# Patient Record
Sex: Female | Born: 1990 | Race: Black or African American | Hispanic: No | Marital: Single | Smoking: Former smoker
Health system: Southern US, Community
[De-identification: ages and names within clinical notes are randomized; demographics above are authoritative.]

## PROBLEM LIST (undated history)

## (undated) ENCOUNTER — Inpatient Hospital Stay (HOSPITAL_COMMUNITY): Payer: Self-pay

## (undated) DIAGNOSIS — Z8759 Personal history of other complications of pregnancy, childbirth and the puerperium: Secondary | ICD-10-CM

## (undated) DIAGNOSIS — R569 Unspecified convulsions: Secondary | ICD-10-CM

## (undated) DIAGNOSIS — O139 Gestational [pregnancy-induced] hypertension without significant proteinuria, unspecified trimester: Secondary | ICD-10-CM

## (undated) HISTORY — DX: Personal history of other complications of pregnancy, childbirth and the puerperium: Z87.59

## (undated) HISTORY — PX: NO PAST SURGERIES: SHX2092

---
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
## 2010-08-23 LAB — CBC
HCT: 32.9 % — ABNORMAL LOW (ref 36.0–46.0)
Hemoglobin: 10.6 g/dL — ABNORMAL LOW (ref 12.0–15.0)
MCHC: 32.3 g/dL (ref 30.0–36.0)
MCV: 82.3 fL (ref 78.0–100.0)
MCV: 82.9 fL (ref 78.0–100.0)
Platelets: 146 10*3/uL — ABNORMAL LOW (ref 150–400)
RBC: 4 MIL/uL (ref 3.87–5.11)
RDW: 14.3 % (ref 11.5–15.5)
RDW: 14.7 % (ref 11.5–15.5)
WBC: 13.5 10*3/uL — ABNORMAL HIGH (ref 4.0–10.5)

## 2010-09-06 LAB — URINE CULTURE: Colony Count: 1000

## 2010-09-06 LAB — URINE MICROSCOPIC-ADD ON

## 2010-09-06 LAB — WET PREP, GENITAL
Clue Cells Wet Prep HPF POC: NONE SEEN
Trich, Wet Prep: NONE SEEN

## 2010-09-06 LAB — URINALYSIS, ROUTINE W REFLEX MICROSCOPIC
Bilirubin Urine: NEGATIVE
Ketones, ur: NEGATIVE mg/dL
Protein, ur: NEGATIVE mg/dL
Specific Gravity, Urine: 1.015 (ref 1.005–1.030)

## 2010-09-06 LAB — GC/CHLAMYDIA PROBE AMP, GENITAL: GC Probe Amp, Genital: NEGATIVE

## 2010-10-17 NOTE — Discharge Summary (Signed)
NAME:  Jessica Hunter, BAL NO.:  [id number]   MEDICAL RECORD NO.:  [id number]          PATIENT TYPE:  INP   LOCATION:  9305                          FACILITY:  [facility]   PHYSICIAN:  Karlton Lemon, MD      DATE OF BIRTH:  [birth year]   DATE OF ADMISSION:  07/19/2007  DATE OF DISCHARGE:  07/21/2007                               DISCHARGE SUMMARY   ADMISSION DIAGNOSES:  1. Eclamptic seizure.  2. Postpartum day #6 from spontaneous vaginal delivery.   DISCHARGE DIAGNOSES:  1. Eclamptic seizure.  2. Postpartum day #8 from spontaneous vaginal delivery.   PROCEDURE:  None.   COMPLICATIONS:  The patient did have a seizure after arriving at the  [facility] emergency department that lasted a short period, no seizures  after initial presenting seizure.   CONSULTATIONS:  None.   LABORATORY DATA:  Jessica Hunter had admission CBC showing white blood cell  count 10.4, hemoglobin 12.3, hematocrit 38.0, platelets 292. Complete  metabolic panel showed sodium 139, potassium 3.6, chloride 106, CO2 22,  glucose 113, BUN 4, creatinine 0.61. Total bilirubin 0.6, alkaline  phosphatase 130, AST 19, ALT less than 8, total protein 6.6. Albumin  2.7, calcium 8.8. Urinalysis showed a small amount of blood, 100 of  protein, 1.0 urobilinogen, otherwise negative. Urine microscopy showed 7-  10 red blood cells. PTT was 26, PT was 13.0, INR was 1.0.   BRIEF ADMISSION HISTORY:  Jessica Hunter is a 20 year old, gravida 1, para 1-  0-0-1 and is postpartum day #6 from a spontaneous vaginal delivery here  at [facility]. She reportedly had a seizure that was generalized  tonic-clonic lasting 3-5 minutes at home. Emergency medical services  responded and in route to [facility] she had another seizure  lasting approximately 2-3 minutes. She did have one more seizure in the  [facility] emergency department of a short duration lasting 1-2 minutes.  She was initially assessed there and found to be  postictal. She was  started on magnesium 4 grams over 4 hours and transferred over to  [facility] for further management.   HOSPITAL COURSE:  The patient was admitted. Admission vitals were  temperature 98.2, pulse 78, respirations 16, blood pressure 136/74. She  did complain of a headache but otherwise her vision had no changes and  there was no abdominal pain. Her reflexes were 1+ without clonus. Her  neurologic clinical exam was grossly intact. Labs were as stated above.  She was continued on magnesium at 2 grams per hour and monitored for any  other neurologic symptoms. She was kept in the ICU until her magnesium  was stopped at noon on hospital day #2. She was transferred out of the  unit. Her blood pressure stayed in the 130 to 140/80-90 systolic range.  She was started on hydrochlorothiazide 25 mg. Hospital day #2, she still  had no neurologic symptoms. Blood pressures were ranging in the 130-140s  systolic over 80-90s diastolic. She continued to have no neurologic  symptoms and her physical examination was otherwise stable. She was  stable for discharge  on hospital day #3.   DISCHARGE STATUS:  Stable.   DISCHARGE MEDICATIONS:  1. Hydrochlorothiazide 25 mg by mouth once daily.  2. Prenatal vitamins 1 tablet by mouth daily.   DISCHARGE INSTRUCTIONS:  1. Discharged to home.  2. Activity as tolerated.  3. Regular diet.  4. The patient is to followup with her blood pressure check on      July 25, 2007 at 10 a.m. at the [facility]. The      patient has been informed of this appointment.  5. She is to followup at her postpartum examination a the [facility] Department at 6 weeks postpartum. She has been      instructed to return to the clinic if she develops new headaches,      vision changes other concerns.      Karlton Lemon, MD  Electronically Signed     NS/MEDQ  D:  07/21/2007  T:  07/22/2007  Job:  [id number]

## 2011-02-23 LAB — COMPREHENSIVE METABOLIC PANEL
ALT: <8
AST: 19
Alkaline Phosphatase: 180 — ABNORMAL HIGH
Alkaline Phosphatase: 130 — ABNORMAL HIGH
BUN: 1 — ABNORMAL LOW
BUN: 4 — ABNORMAL LOW
CO2: 25
Calcium: 8.3 — ABNORMAL LOW
Calcium: 8.8
Creatinine, Ser: 0.61
Glucose, Bld: 102 — ABNORMAL HIGH
Glucose, Bld: 113 — ABNORMAL HIGH
Potassium: 3.6
Total Bilirubin: 0.7
Total Bilirubin: 0.6
Total Protein: 5.8 — ABNORMAL LOW

## 2011-02-23 LAB — URINE MICROSCOPIC-ADD ON

## 2011-02-23 LAB — URINALYSIS, ROUTINE W REFLEX MICROSCOPIC
Ketones, ur: NEGATIVE
Leukocytes, UA: NEGATIVE
Specific Gravity, Urine: 1.016
Urobilinogen, UA: 1
pH: 7.5

## 2011-02-23 LAB — CBC
Hemoglobin: 10.8 — ABNORMAL LOW
MCHC: 32.3
MCV: 78.8
MCV: 79.8
Platelets: 225
RBC: 4.09
RBC: 4.76
RDW: 15.4

## 2011-03-13 LAB — CBC
MCHC: 33
MCV: 81.7
RDW: 13.7

## 2011-03-13 LAB — DIFFERENTIAL
Basophils Relative: 0
Eosinophils Absolute: 0.1 — ABNORMAL LOW
Eosinophils Relative: 1
Lymphocytes Relative: 11 — ABNORMAL LOW
Monocytes Relative: 5

## 2011-11-07 ENCOUNTER — Inpatient Hospital Stay (HOSPITAL_COMMUNITY)
Admission: AD | Admit: 2011-11-07 | Discharge: 2011-11-07 | Disposition: A | Discharge status: Home / Self Care | Payer: Self-pay | Source: Ambulatory Visit | Attending: Obstetrics and Gynecology | Admitting: Obstetrics and Gynecology

## 2011-11-07 DIAGNOSIS — K5900 Constipation, unspecified: Secondary | ICD-10-CM | POA: Insufficient documentation

## 2011-11-07 DIAGNOSIS — Z3202 Encounter for pregnancy test, result negative: Secondary | ICD-10-CM | POA: Insufficient documentation

## 2011-11-07 DIAGNOSIS — R109 Unspecified abdominal pain: Secondary | ICD-10-CM | POA: Insufficient documentation

## 2011-11-07 NOTE — MAU Note (Signed)
Lower abdominal pain onset last week, LMP 10/19/11 had negative pregnancy test here, no N/V/D, constipation unsure of when last BM was.

## 2011-11-07 NOTE — MAU Note (Signed)
Patient not in lobby x3 

## 2012-02-25 ENCOUNTER — Inpatient Hospital Stay (HOSPITAL_COMMUNITY): Payer: Self-pay

## 2012-02-25 ENCOUNTER — Inpatient Hospital Stay (HOSPITAL_COMMUNITY)
Admission: AD | Admit: 2012-02-25 | Discharge: 2012-02-25 | Disposition: A | Discharge status: Home / Self Care | Payer: Self-pay | Source: Ambulatory Visit | Attending: Obstetrics and Gynecology | Admitting: Obstetrics and Gynecology

## 2012-02-25 ENCOUNTER — Encounter (HOSPITAL_COMMUNITY): Payer: Self-pay | Admitting: *Deleted

## 2012-02-25 DIAGNOSIS — O4690 Antepartum hemorrhage, unspecified, unspecified trimester: Secondary | ICD-10-CM

## 2012-02-25 DIAGNOSIS — O209 Hemorrhage in early pregnancy, unspecified: Secondary | ICD-10-CM | POA: Insufficient documentation

## 2012-02-25 LAB — ABO/RH: ABO/RH(D): AB POS

## 2012-02-25 LAB — CBC
MCH: 26.3 pg (ref 26.0–34.0)
MCHC: 32.4 g/dL (ref 30.0–36.0)
Platelets: 346 10*3/uL (ref 150–400)
RBC: 4.63 MIL/uL (ref 3.87–5.11)
RDW: 13.6 % (ref 11.5–15.5)

## 2012-02-25 NOTE — MAU Note (Signed)
Pt is [redacted]weeks pregnant and an hour ago had a "gush" of blood.

## 2012-02-25 NOTE — MAU Provider Note (Signed)
History     CSN: [id number]  Arrival date and time: 02/25/12 1524   First Provider Initiated Contact with Patient 02/25/12 1640      Chief Complaint  Patient presents with  . Vaginal Bleeding   HPI Jessica Hunter is 21 y.o. [id number] [redacted]w[redacted]d weeks presenting with vaginal bleeding that began 2 hours ago but now has stopped. Large amount "like a period".  Last sexual activity was last night.  + HPT at home and [facility].   Not planned, not using contraception. Denies vaginal bleeding at this time.    Past Medical History  Diagnosis Date  . No pertinent past medical history     Past Surgical History  Procedure Date  . No past surgeries     Family History  Problem Relation Age of Onset  . Other Neg Hx     History  Substance Use Topics  . Smoking status: Never Smoker   . Smokeless tobacco: Never Used  . Alcohol Use: No    Allergies: No Known Allergies  No prescriptions prior to admission    Review of Systems  Constitutional: Negative.   Respiratory: Negative.   Cardiovascular: Negative.   Gastrointestinal: Negative for nausea, vomiting and abdominal pain.  Genitourinary:       + vaginal bleeding X 2 hrs today==red   Physical Exam   Blood pressure 140/77, pulse 91, temperature 98.1 F (36.7 C), temperature source Oral, resp. rate 20, height 5\' 5"  (1.651 m), weight 54.432 kg (120 lb), last menstrual period 01/19/2012.  Physical Exam  Constitutional: She is oriented to person, place, and time. She appears well-developed and well-nourished. No distress.  HENT:  Head: Normocephalic.  Neck: Normal range of motion.  Cardiovascular: Normal rate.   Respiratory: Effort normal.  GI: Soft. She exhibits no distension and no mass. There is no tenderness. There is no rebound and no guarding.  Genitourinary: Uterus is enlarged. Uterus is not tender. Right adnexum displays no mass, no tenderness and no fullness. Left adnexum displays no mass, no  tenderness and no fullness. No bleeding (no active bleeding) around the vagina. Injury: small amount of dark brown discharge. Vaginal discharge found.  Neurological: She is alert and oriented to person, place, and time.  Skin: Skin is warm and dry.  Psychiatric: She has a normal mood and affect. Her behavior is normal.   Results for orders placed during the hospital encounter of 02/25/12 (from the past 24 hour(s))  CBC     Status: Normal   Collection Time   02/25/12  5:10 PM      Component Value Range   WBC 10.5  4.0 - 10.5 K/uL   RBC 4.63  3.87 - 5.11 MIL/uL   Hemoglobin 12.2  12.0 - 15.0 g/dL   HCT 14.7  82.9 - 56.2 %   MCV 81.4  78.0 - 100.0 fL   MCH 26.3  26.0 - 34.0 pg   MCHC 32.4  30.0 - 36.0 g/dL   RDW 13.0  86.5 - 78.4 %   Platelets 346  150 - 400 K/uL  ABO/RH     Status: Normal (Preliminary result)   Collection Time   02/25/12  5:10 PM      Component Value Range   ABO/RH(D) AB POS    HCG, QUANTITATIVE, PREGNANCY     Status: Abnormal   Collection Time   02/25/12  5:10 PM      Component Value Range   hCG, Beta Chain, Quant,  S 4979 (*) <5 mIU/mL  WET PREP, GENITAL     Status: Abnormal   Collection Time   02/25/12  5:13 PM      Component Value Range   Yeast Wet Prep HPF POC FEW (*) NONE SEEN   Trich, Wet Prep NONE SEEN  NONE SEEN   Clue Cells Wet Prep HPF POC NONE SEEN  NONE SEEN   WBC, Wet Prep HPF POC MODERATE (*) NONE SEEN    MAU Course  Procedures  GC/CHL culture to lab  MDM   Assessment and Plan  A;  Vaginal  Bleeding in early pregnancy  [redacted]w[redacted]d gestation     Moderate to Large Subchorionic hemorrhage  P:  Discussed findings with the patient--cautioned about mod-lg [facility]       May begin prenatal care with doctor of her choice.    Aceton Kinnear,EVE M 02/25/2012, 7:03 PM

## 2012-02-27 LAB — GC/CHLAMYDIA PROBE AMP, GENITAL: Chlamydia, DNA Probe: POSITIVE — AB

## 2012-03-31 ENCOUNTER — Other Ambulatory Visit (HOSPITAL_COMMUNITY): Payer: Self-pay | Admitting: Obstetrics and Gynecology

## 2012-03-31 DIAGNOSIS — O3680X0 Pregnancy with inconclusive fetal viability, not applicable or unspecified: Secondary | ICD-10-CM

## 2012-04-01 ENCOUNTER — Ambulatory Visit (HOSPITAL_COMMUNITY)
Admission: RE | Admit: 2012-04-01 | Discharge: 2012-04-01 | Disposition: A | Discharge status: Home / Self Care | Payer: Self-pay | Source: Ambulatory Visit | Attending: Obstetrics and Gynecology | Admitting: Obstetrics and Gynecology

## 2012-04-01 DIAGNOSIS — Z3689 Encounter for other specified antenatal screening: Secondary | ICD-10-CM | POA: Insufficient documentation

## 2012-04-01 DIAGNOSIS — O3680X0 Pregnancy with inconclusive fetal viability, not applicable or unspecified: Secondary | ICD-10-CM | POA: Insufficient documentation

## 2012-04-22 ENCOUNTER — Encounter (HOSPITAL_COMMUNITY): Payer: Self-pay

## 2012-05-21 ENCOUNTER — Other Ambulatory Visit (HOSPITAL_COMMUNITY): Payer: Self-pay | Admitting: Physician Assistant

## 2012-05-21 DIAGNOSIS — Z3689 Encounter for other specified antenatal screening: Secondary | ICD-10-CM

## 2012-05-21 DIAGNOSIS — IMO0002 Reserved for concepts with insufficient information to code with codable children: Secondary | ICD-10-CM | POA: Insufficient documentation

## 2012-05-21 LAB — OB RESULTS CONSOLE RPR: RPR: NONREACTIVE

## 2012-05-21 LAB — OB RESULTS CONSOLE ABO/RH: RH Type: POSITIVE

## 2012-05-21 LAB — OB RESULTS CONSOLE HIV ANTIBODY (ROUTINE TESTING): HIV: NONREACTIVE

## 2012-05-21 LAB — OB RESULTS CONSOLE GC/CHLAMYDIA: Chlamydia: NEGATIVE

## 2012-06-02 ENCOUNTER — Ambulatory Visit (HOSPITAL_COMMUNITY)
Admission: RE | Admit: 2012-06-02 | Discharge: 2012-06-02 | Disposition: A | Discharge status: Home / Self Care | Payer: Medicaid Other | Source: Ambulatory Visit | Attending: Physician Assistant | Admitting: Physician Assistant

## 2012-06-02 DIAGNOSIS — O358XX0 Maternal care for other (suspected) fetal abnormality and damage, not applicable or unspecified: Secondary | ICD-10-CM | POA: Insufficient documentation

## 2012-06-02 DIAGNOSIS — Z1389 Encounter for screening for other disorder: Secondary | ICD-10-CM | POA: Insufficient documentation

## 2012-06-02 DIAGNOSIS — Z3689 Encounter for other specified antenatal screening: Secondary | ICD-10-CM

## 2012-06-02 DIAGNOSIS — Z363 Encounter for antenatal screening for malformations: Secondary | ICD-10-CM | POA: Insufficient documentation

## 2012-06-04 NOTE — L&D Delivery Note (Signed)
Delivery Note At 1:57 PM a viable female was delivered via Vaginal, Spontaneous Delivery (Presentation: Right Occiput Anterior).  APGAR: 8, 9; weight .   Placenta status: Intact, Spontaneous.  Cord: 3 vessels with the following complications: None.  Cord pH: N/A  Anesthesia: Epidural  Episiotomy: None Lacerations: None Suture Repair: N/A Est. Blood Loss (mL): 300  Mom to postpartum.  Baby to nursery-stable.  [name] Other 10/21/2012, 2:16 PM

## 2012-06-04 NOTE — L&D Delivery Note (Signed)
I was present for the delivery and agree with above.  [place], CNM 10/21/2012 9:05 PM

## 2012-06-11 ENCOUNTER — Other Ambulatory Visit (HOSPITAL_COMMUNITY): Payer: Self-pay | Admitting: Physician Assistant

## 2012-06-11 DIAGNOSIS — O358XX0 Maternal care for other (suspected) fetal abnormality and damage, not applicable or unspecified: Secondary | ICD-10-CM

## 2012-06-18 ENCOUNTER — Ambulatory Visit (HOSPITAL_COMMUNITY)
Admission: RE | Admit: 2012-06-18 | Discharge: 2012-06-18 | Disposition: A | Discharge status: Home / Self Care | Payer: Medicaid Other | Source: Ambulatory Visit | Attending: Physician Assistant | Admitting: Physician Assistant

## 2012-06-18 DIAGNOSIS — Z3689 Encounter for other specified antenatal screening: Secondary | ICD-10-CM | POA: Insufficient documentation

## 2012-06-18 DIAGNOSIS — O358XX0 Maternal care for other (suspected) fetal abnormality and damage, not applicable or unspecified: Secondary | ICD-10-CM

## 2012-09-27 ENCOUNTER — Encounter (HOSPITAL_COMMUNITY): Payer: Self-pay | Admitting: *Deleted

## 2012-09-27 ENCOUNTER — Inpatient Hospital Stay (HOSPITAL_COMMUNITY)
Admission: AD | Admit: 2012-09-27 | Discharge: 2012-09-27 | Disposition: A | Discharge status: Home / Self Care | Payer: Medicaid Other | Source: Ambulatory Visit | Attending: Obstetrics & Gynecology | Admitting: Obstetrics & Gynecology

## 2012-09-27 DIAGNOSIS — M545 Low back pain, unspecified: Secondary | ICD-10-CM

## 2012-09-27 DIAGNOSIS — R350 Frequency of micturition: Secondary | ICD-10-CM | POA: Insufficient documentation

## 2012-09-27 DIAGNOSIS — O99891 Other specified diseases and conditions complicating pregnancy: Secondary | ICD-10-CM | POA: Insufficient documentation

## 2012-09-27 DIAGNOSIS — O9989 Other specified diseases and conditions complicating pregnancy, childbirth and the puerperium: Secondary | ICD-10-CM

## 2012-09-27 LAB — URINE MICROSCOPIC-ADD ON

## 2012-09-27 LAB — URINALYSIS, ROUTINE W REFLEX MICROSCOPIC
Nitrite: NEGATIVE
Specific Gravity, Urine: 1.01 (ref 1.005–1.030)
Urobilinogen, UA: 1 mg/dL (ref 0.0–1.0)
pH: 7 (ref 5.0–8.0)

## 2012-09-27 NOTE — MAU Provider Note (Signed)
  History     CSN: [id number]  Arrival date and time: 09/27/12 1015   None     Chief Complaint  Patient presents with  . Back Pain   HPI Jessica Hunter is a 22 y.o. G40P2002 female at [redacted]w[redacted]d who reports intermittent achy low back pain x 3 days.  Reports good fm. Denies uc's, lof, or vb.  Reports urinary frequency x 2 months. Denies dysuria, hesitancy, or urgency.  Dx w/ UTI 2wks ago and completed therapy.  Next visit on Mon 4/28 @ [facility].   OB History   Grav Para Term Preterm Abortions TAB SAB Ect Mult Living   3 2 2       2       Past Medical History  Diagnosis Date  . No pertinent past medical history     Past Surgical History  Procedure Laterality Date  . No past surgeries      Family History  Problem Relation Age of Onset  . Other Neg Hx     History  Substance Use Topics  . Smoking status: Never Smoker   . Smokeless tobacco: Never Used  . Alcohol Use: No    Allergies: No Known Allergies  No prescriptions prior to admission    Review of Systems  Constitutional: Negative.  Negative for fever and chills.  HENT: Negative.   Eyes: Negative.   Respiratory: Negative.   Cardiovascular: Negative.   Gastrointestinal: Positive for nausea. Negative for vomiting and abdominal pain.  Genitourinary: Positive for frequency. Negative for dysuria, urgency, hematuria and flank pain.  Musculoskeletal: Positive for back pain (intermittent achy low back pain ).  Skin: Negative.   Neurological: Negative.   Endo/Heme/Allergies: Negative.   Psychiatric/Behavioral: Negative.    Physical Exam   Blood pressure 127/67, pulse 99, temperature 98 F (36.7 C), temperature source Oral, resp. rate 18, height 5\' 6"  (1.676 m), weight 69.673 kg (153 lb 9.6 oz), last menstrual period 01/19/2012.  Physical Exam  Constitutional: She is oriented to person, place, and time. She appears well-developed and well-nourished.  HENT:  Head: Normocephalic.  Neck: Normal range of motion.   Cardiovascular: Normal rate.   Respiratory: Effort normal.  GI: Soft.  gravid  Musculoskeletal: Normal range of motion.  Neurological: She is alert and oriented to person, place, and time.  Skin: Skin is warm and dry.  Psychiatric: She has a normal mood and affect. Her behavior is normal. Judgment and thought content normal.   FHR: 140, mod variability, 15x15accels, no decels= Cat I UCs: ocassional mild MAU Course  Procedures UA NST SVE x 2, no change Exam  Assessment and Plan  A:  [redacted]w[redacted]d SIUP  Low back pain in pregnancy  Cat I FHR P:  D/C home  Keep appt at [facility] on Mon as scheduled  Reviewed ptl s/s  Reviewed fetal kick counts  Marge Duncans 09/27/2012, 11:09 AM

## 2012-09-27 NOTE — MAU Note (Signed)
Pt c/o back pain on and off for the past 3 days. Denies vag bleeding  Reports a white vaginal discharge. Good fetal movement reported

## 2012-09-28 ENCOUNTER — Encounter (HOSPITAL_COMMUNITY): Payer: Self-pay | Admitting: *Deleted

## 2012-09-28 ENCOUNTER — Inpatient Hospital Stay (HOSPITAL_COMMUNITY)
Admission: AD | Admit: 2012-09-28 | Discharge: 2012-09-28 | Disposition: A | Discharge status: Home / Self Care | Payer: Medicaid Other | Source: Ambulatory Visit | Attending: Obstetrics & Gynecology | Admitting: Obstetrics & Gynecology

## 2012-09-28 DIAGNOSIS — O479 False labor, unspecified: Secondary | ICD-10-CM | POA: Insufficient documentation

## 2012-09-28 NOTE — MAU Note (Signed)
Pt presents with complaints of contractions that started late last night and have gotten worse this morning. States she was evaluated yesterday in MAU and was 3 cms dilated. Denies ROM or bleeding

## 2012-10-02 LAB — OB RESULTS CONSOLE GBS: GBS: POSITIVE

## 2012-10-15 ENCOUNTER — Inpatient Hospital Stay (HOSPITAL_COMMUNITY)
Admission: AD | Admit: 2012-10-15 | Discharge: 2012-10-15 | Disposition: A | Discharge status: Home / Self Care | Payer: Medicaid Other | Source: Ambulatory Visit | Attending: Obstetrics and Gynecology | Admitting: Obstetrics and Gynecology

## 2012-10-15 ENCOUNTER — Encounter (HOSPITAL_COMMUNITY): Payer: Self-pay

## 2012-10-15 DIAGNOSIS — N949 Unspecified condition associated with female genital organs and menstrual cycle: Secondary | ICD-10-CM | POA: Insufficient documentation

## 2012-10-15 DIAGNOSIS — O99891 Other specified diseases and conditions complicating pregnancy: Secondary | ICD-10-CM | POA: Insufficient documentation

## 2012-10-15 DIAGNOSIS — O479 False labor, unspecified: Secondary | ICD-10-CM

## 2012-10-15 DIAGNOSIS — M7989 Other specified soft tissue disorders: Secondary | ICD-10-CM | POA: Insufficient documentation

## 2012-10-15 LAB — URINALYSIS, ROUTINE W REFLEX MICROSCOPIC
Bilirubin Urine: NEGATIVE
Glucose, UA: NEGATIVE mg/dL
Protein, ur: NEGATIVE mg/dL
Urobilinogen, UA: 0.2 mg/dL (ref 0.0–1.0)

## 2012-10-15 LAB — URINE MICROSCOPIC-ADD ON

## 2012-10-15 NOTE — MAU Provider Note (Signed)
History     CSN: [id number]  Arrival date and time: 10/15/12 1755   First Provider Initiated Contact with Patient 10/15/12 1844      Chief Complaint  Patient presents with  . Pelvic Pain  . Leg Swelling   HPI Comments: 22 yo G3P2002 @ 38.4 presents with pelvic pain. Pain is groin/supropubic, worse with walking/standing.  Feels different than contractions.  No pain with urination.  Has not tried anything for this pain.  Was checked at the [facility] 2 days ago and told she was 3cm.  She just wants to make sure she is not in labor.   Irregular/occasional ctx, no LOF, no VB, good FM  Pelvic Pain The patient's primary symptoms include pelvic pain. Pertinent negatives include no abdominal pain, dysuria, fever, headaches, nausea or vomiting.    OB History   Grav Para Term Preterm Abortions TAB SAB Ect Mult Living   3 2 2       2       Past Medical History  Diagnosis Date  . No pertinent past medical history   . Medical history non-contributory     Past Surgical History  Procedure Laterality Date  . No past surgeries      Family History  Problem Relation Age of Onset  . Other Neg Hx     History  Substance Use Topics  . Smoking status: Never Smoker   . Smokeless tobacco: Never Used  . Alcohol Use: No    Allergies: No Known Allergies  Prescriptions prior to admission  Medication Sig Dispense Refill  . Prenatal Vit-Fe Fumarate-FA (PRENATAL MULTIVITAMIN) TABS Take 1 tablet by mouth daily.         Review of Systems  Constitutional: Negative for fever.  Respiratory: Negative for shortness of breath.   Cardiovascular: Negative for chest pain.  Gastrointestinal: Negative for nausea, vomiting and abdominal pain.  Genitourinary: Positive for pelvic pain. Negative for dysuria.  Neurological: Negative for headaches.   Physical Exam   Blood pressure 122/80, pulse 100, temperature 98.5 F (36.9 C), temperature source Oral, resp. rate 18, height 5\' 6"  (1.676 m), weight 72.349  kg (159 lb 8 oz), last menstrual period 01/19/2012.  Physical Exam  Nursing note and vitals reviewed. Constitutional: She is oriented to person, place, and time. She appears well-developed and well-nourished. No distress.  HENT:  Head: Normocephalic and atraumatic.  Eyes: EOM are normal. Pupils are equal, round, and reactive to light.  Neck: Normal range of motion. Neck supple.  Cardiovascular: Normal rate and regular rhythm.   Respiratory: Effort normal. No respiratory distress.  GI: Soft. There is no tenderness.  gravid  Musculoskeletal: She exhibits no edema.  Neurological: She is alert and oriented to person, place, and time.  Skin: Skin is warm and dry. No rash noted. She is not diaphoretic.  Dilation: 3 Effacement (%): 50 Cervical Position: Middle Station: -3 Presentation: Vertex Exam by:: Dr. Fara Boros   FHT: 150, mod variability, + accel, no decel  Toco: no contractions  MAU Course  Procedures  MDM Cervix unchanged from office check 2 days ago, no ctx on monitor  Assessment and Plan  22 yo G3P2002 @ 38.4 p/w complaints of pelvic discomfort -Likely MSK in nature, no cervical change, no contractions, no vaginal bleeding, no abdominal tenderness -Advised tylenol, warm bath/shower, heating pad for symptomatic treatment -Labor and bleed precautions discussed. -F/u with OB next week as scheduled   MCGILL,JACQUELYN 10/15/2012, 6:47 PM   I saw and examined patient and agree  with above resident note. I reviewed history, imaging, labs, and vitals. I personally reviewed the fetal heart tracing, and it is reactive. Napoleon Form, MD

## 2012-10-15 NOTE — MAU Note (Signed)
Patient is in with c/o pelvic pain and bilateral lower extremity swelling. She denies vaginal bleeding or lof. She reports good fetal movement.

## 2012-10-16 LAB — URINE CULTURE: Culture: NO GROWTH

## 2012-10-17 NOTE — MAU Provider Note (Signed)
Attestation of Attending Supervision of Advanced Practitioner (CNM/NP)/[employer]/Fellow: Evaluation and management procedures were performed by the Advanced Practitioner under my supervision and collaboration.  I have reviewed the Advanced Practitioner's note and chart, and I agree with the management and plan.  HARRAWAY-Koudelka, Evelette Hollern 9:44 AM

## 2012-10-21 ENCOUNTER — Inpatient Hospital Stay (HOSPITAL_COMMUNITY): Payer: Medicaid Other | Admitting: Anesthesiology

## 2012-10-21 ENCOUNTER — Encounter (HOSPITAL_COMMUNITY): Payer: Self-pay | Admitting: *Deleted

## 2012-10-21 ENCOUNTER — Encounter (HOSPITAL_COMMUNITY): Payer: Self-pay | Admitting: Anesthesiology

## 2012-10-21 ENCOUNTER — Inpatient Hospital Stay (HOSPITAL_COMMUNITY)
Admission: AD | Admit: 2012-10-21 | Discharge: 2012-10-23 | DRG: 775 | Disposition: A | Discharge status: Home / Self Care | Payer: Medicaid Other | Source: Ambulatory Visit | Attending: Obstetrics & Gynecology | Admitting: Obstetrics & Gynecology

## 2012-10-21 DIAGNOSIS — O4290 Premature rupture of membranes, unspecified as to length of time between rupture and onset of labor, unspecified weeks of gestation: Secondary | ICD-10-CM

## 2012-10-21 DIAGNOSIS — IMO0001 Reserved for inherently not codable concepts without codable children: Secondary | ICD-10-CM

## 2012-10-21 DIAGNOSIS — Z22330 Carrier of Group B streptococcus: Secondary | ICD-10-CM

## 2012-10-21 DIAGNOSIS — B951 Streptococcus, group B, as the cause of diseases classified elsewhere: Secondary | ICD-10-CM | POA: Diagnosis present

## 2012-10-21 DIAGNOSIS — O9989 Other specified diseases and conditions complicating pregnancy, childbirth and the puerperium: Secondary | ICD-10-CM

## 2012-10-21 DIAGNOSIS — O99892 Other specified diseases and conditions complicating childbirth: Secondary | ICD-10-CM | POA: Diagnosis present

## 2012-10-21 LAB — CBC
MCH: 26.5 pg (ref 26.0–34.0)
MCHC: 32.5 g/dL (ref 30.0–36.0)
MCV: 81.3 fL (ref 78.0–100.0)
Platelets: 154 10*3/uL (ref 150–400)
RBC: 4.61 MIL/uL (ref 3.87–5.11)
RDW: 15.3 % (ref 11.5–15.5)

## 2012-10-21 LAB — TYPE AND SCREEN: ABO/RH(D): AB POS

## 2012-10-21 MED ORDER — OXYTOCIN BOLUS FROM INFUSION: 500.0000 mL | INTRAVENOUS | Status: DC

## 2012-10-21 MED ORDER — ONDANSETRON HCL 4 MG/2ML IJ SOLN: 4.0000 mg | Freq: Four times a day (QID) | INTRAMUSCULAR | Status: DC | PRN

## 2012-10-21 MED ORDER — SIMETHICONE 80 MG PO CHEW: 80.0000 mg | CHEWABLE_TABLET | ORAL | Status: DC | PRN

## 2012-10-21 MED ORDER — CITRIC ACID-SODIUM CITRATE 334-500 MG/5ML PO SOLN: 30.0000 mL | ORAL | Status: DC | PRN

## 2012-10-21 MED ORDER — LACTATED RINGERS IV SOLN
INTRAVENOUS | Status: DC
  Administered 2012-10-21 (×2): via INTRAVENOUS

## 2012-10-21 MED ORDER — WITCH HAZEL-GLYCERIN EX PADS: 1.0000 "application " | MEDICATED_PAD | CUTANEOUS | Status: DC | PRN

## 2012-10-21 MED ORDER — OXYTOCIN 40 UNITS IN LACTATED RINGERS INFUSION - SIMPLE MED
1.0000 m[IU]/min | INTRAVENOUS | Status: DC
  Administered 2012-10-21: 2 m[IU]/min via INTRAVENOUS
  Filled 2012-10-21: qty 1000

## 2012-10-21 MED ORDER — ONDANSETRON HCL 4 MG PO TABS: 4.0000 mg | ORAL_TABLET | ORAL | Status: DC | PRN

## 2012-10-21 MED ORDER — EPHEDRINE 5 MG/ML INJ
10.0000 mg | INTRAVENOUS | Status: DC | PRN
  Filled 2012-10-21: qty 4
  Filled 2012-10-21: qty 2

## 2012-10-21 MED ORDER — TETANUS-DIPHTH-ACELL PERTUSSIS 5-2.5-18.5 LF-MCG/0.5 IM SUSP
0.5000 mL | Freq: Once | INTRAMUSCULAR | Status: AC
  Administered 2012-10-22: 0.5 mL via INTRAMUSCULAR
  Filled 2012-10-21: qty 0.5

## 2012-10-21 MED ORDER — AMPICILLIN SODIUM 1 G IJ SOLR
1.0000 g | INTRAMUSCULAR | Status: DC
  Administered 2012-10-21 (×2): 1 g via INTRAVENOUS
  Filled 2012-10-21 (×6): qty 1000

## 2012-10-21 MED ORDER — DIBUCAINE 1 % RE OINT: 1.0000 "application " | TOPICAL_OINTMENT | RECTAL | Status: DC | PRN

## 2012-10-21 MED ORDER — LACTATED RINGERS IV SOLN: 500.0000 mL | INTRAVENOUS | Status: DC | PRN

## 2012-10-21 MED ORDER — ACETAMINOPHEN 325 MG PO TABS: 650.0000 mg | ORAL_TABLET | ORAL | Status: DC | PRN

## 2012-10-21 MED ORDER — SODIUM CHLORIDE 0.9 % IV SOLN
2.0000 g | Freq: Once | INTRAVENOUS | Status: AC
  Administered 2012-10-21: 2 g via INTRAVENOUS
  Filled 2012-10-21: qty 2000

## 2012-10-21 MED ORDER — OXYCODONE-ACETAMINOPHEN 5-325 MG PO TABS
1.0000 | ORAL_TABLET | ORAL | Status: DC | PRN
  Administered 2012-10-22: 1 via ORAL
  Filled 2012-10-21: qty 1

## 2012-10-21 MED ORDER — OXYTOCIN 40 UNITS IN LACTATED RINGERS INFUSION - SIMPLE MED: 62.5000 mL/h | INTRAVENOUS | Status: DC

## 2012-10-21 MED ORDER — TERBUTALINE SULFATE 1 MG/ML IJ SOLN: 0.2500 mg | Freq: Once | INTRAMUSCULAR | Status: DC | PRN

## 2012-10-21 MED ORDER — ZOLPIDEM TARTRATE 5 MG PO TABS: 5.0000 mg | ORAL_TABLET | Freq: Every evening | ORAL | Status: DC | PRN

## 2012-10-21 MED ORDER — LANOLIN HYDROUS EX OINT: TOPICAL_OINTMENT | CUTANEOUS | Status: DC | PRN

## 2012-10-21 MED ORDER — PHENYLEPHRINE 40 MCG/ML (10ML) SYRINGE FOR IV PUSH (FOR BLOOD PRESSURE SUPPORT)
80.0000 ug | PREFILLED_SYRINGE | INTRAVENOUS | Status: DC | PRN
  Filled 2012-10-21: qty 2
  Filled 2012-10-21: qty 5

## 2012-10-21 MED ORDER — PRENATAL MULTIVITAMIN CH
1.0000 | ORAL_TABLET | Freq: Every day | ORAL | Status: DC
  Administered 2012-10-22 – 2012-10-23 (×2): 1 via ORAL
  Filled 2012-10-21 (×2): qty 1

## 2012-10-21 MED ORDER — NALBUPHINE SYRINGE 5 MG/0.5 ML
10.0000 mg | INJECTION | INTRAMUSCULAR | Status: DC | PRN
  Filled 2012-10-21: qty 1

## 2012-10-21 MED ORDER — IBUPROFEN 600 MG PO TABS
600.0000 mg | ORAL_TABLET | Freq: Four times a day (QID) | ORAL | Status: DC
  Administered 2012-10-21 – 2012-10-23 (×8): 600 mg via ORAL
  Filled 2012-10-21 (×8): qty 1

## 2012-10-21 MED ORDER — OXYCODONE-ACETAMINOPHEN 5-325 MG PO TABS: 1.0000 | ORAL_TABLET | ORAL | Status: DC | PRN

## 2012-10-21 MED ORDER — DIPHENHYDRAMINE HCL 50 MG/ML IJ SOLN
12.5000 mg | INTRAMUSCULAR | Status: DC | PRN
  Administered 2012-10-21: 12.5 mg via INTRAVENOUS
  Filled 2012-10-21: qty 1

## 2012-10-21 MED ORDER — IBUPROFEN 600 MG PO TABS: 600.0000 mg | ORAL_TABLET | Freq: Four times a day (QID) | ORAL | Status: DC | PRN

## 2012-10-21 MED ORDER — PHENYLEPHRINE 40 MCG/ML (10ML) SYRINGE FOR IV PUSH (FOR BLOOD PRESSURE SUPPORT)
80.0000 ug | PREFILLED_SYRINGE | INTRAVENOUS | Status: DC | PRN
  Filled 2012-10-21: qty 2

## 2012-10-21 MED ORDER — FENTANYL 2.5 MCG/ML BUPIVACAINE 1/10 % EPIDURAL INFUSION (WH - ANES)
14.0000 mL/h | INTRAMUSCULAR | Status: DC | PRN
  Administered 2012-10-21 (×2): 14 mL/h via EPIDURAL
  Filled 2012-10-21 (×2): qty 125

## 2012-10-21 MED ORDER — BENZOCAINE-MENTHOL 20-0.5 % EX AERO
1.0000 "application " | INHALATION_SPRAY | CUTANEOUS | Status: DC | PRN
  Administered 2012-10-23: 1 via TOPICAL
  Filled 2012-10-21: qty 56

## 2012-10-21 MED ORDER — SENNOSIDES-DOCUSATE SODIUM 8.6-50 MG PO TABS
2.0000 | ORAL_TABLET | Freq: Every day | ORAL | Status: DC
  Administered 2012-10-21 – 2012-10-22 (×2): 2 via ORAL

## 2012-10-21 MED ORDER — EPHEDRINE 5 MG/ML INJ
10.0000 mg | INTRAVENOUS | Status: DC | PRN
  Filled 2012-10-21: qty 2

## 2012-10-21 MED ORDER — ONDANSETRON HCL 4 MG/2ML IJ SOLN: 4.0000 mg | INTRAMUSCULAR | Status: DC | PRN

## 2012-10-21 MED ORDER — DIPHENHYDRAMINE HCL 25 MG PO CAPS: 25.0000 mg | ORAL_CAPSULE | Freq: Four times a day (QID) | ORAL | Status: DC | PRN

## 2012-10-21 MED ORDER — LIDOCAINE HCL (PF) 1 % IJ SOLN
30.0000 mL | INTRAMUSCULAR | Status: DC | PRN
  Filled 2012-10-21: qty 30

## 2012-10-21 MED ORDER — LACTATED RINGERS IV SOLN
500.0000 mL | Freq: Once | INTRAVENOUS | Status: AC
  Administered 2012-10-21: 500 mL via INTRAVENOUS

## 2012-10-21 MED ORDER — LIDOCAINE HCL (PF) 1 % IJ SOLN
INTRAMUSCULAR | Status: DC | PRN
  Administered 2012-10-21 (×2): 5 mL

## 2012-10-21 NOTE — Progress Notes (Signed)
I was consulted RE: POC and agree with above.  [place], [place] 10/21/2012 10:37 AM

## 2012-10-21 NOTE — Progress Notes (Signed)
Patient ID: Jessica Hunter, female   DOB: [birth year], 22 y.o.   MRN: [id number]   S:  Pt comfortable with epidural  O:   Filed Vitals:   10/21/12 0458 10/21/12 0502 10/21/12 0532 10/21/12 0604  BP:  118/85 123/67 122/91  Pulse: 107 112 81 123  Temp:      TempSrc:      Resp:  20 20 20   Height:      Weight:      SpO2: 100%       Cervix:  6/50/-2 FHTs:  140, moderate var, accels present, no decels TOCO:  q 2-6 minutes  A/P 22 y.o. G3P2002 at [redacted]w[redacted]d presenting in active labor - Ctx spaced out after epidural, but some change in cervix - Will start pitocin - Anticipate SVD  Napoleon Form, MD

## 2012-10-21 NOTE — Plan of Care (Signed)
Problem: Consults Goal: Birthing Suites Patient Information Press F2 to bring up selections list   Pt 37-[redacted] weeks EGA     

## 2012-10-21 NOTE — Progress Notes (Signed)
Jessica Hunter is a 22 y.o. G3P2002 at [redacted]w[redacted]d.   Subjective: Comfortable w/epidural  Objective: BP 135/68  Pulse 132  Temp(Src) 98.3 F (36.8 C) (Oral)  Resp 21  Ht 5\' 6"  (1.676 m)  Wt 72.303 kg (159 lb 6.4 oz)  BMI 25.74 kg/m2  SpO2 100%  LMP 01/19/2012   Total I/O In: -  Out: 600 [Urine:600]  FHT:  FHR: 160 bpm, variability: moderate,  accelerations:  Present,  decelerations:  Absent UC:   regular, every 2 minutes SVE:   Dilation: 6.5 Effacement (%): 50 Station: -2 Exam by:: [place], CNM AROM moderate amount of clear fluid.  Labs: Lab Results  Component Value Date   WBC 10.9* 10/21/2012   HGB 12.2 10/21/2012   HCT 37.5 10/21/2012   MCV 81.3 10/21/2012   PLT 154 10/21/2012    Assessment / Plan: Augmentation of labor, progressing well  Labor: Progressing normally Preeclampsia:  NA Fetal Wellbeing:  Category I Pain Control:  Epidural I/D:  n/a Anticipated MOD:  NSVD  Muhs, Emmerie Battaglia 10/21/2012, 12:14 PM

## 2012-10-21 NOTE — Anesthesia Preprocedure Evaluation (Signed)

## 2012-10-21 NOTE — MAU Note (Signed)
Pt G3 P2 at 39.3wks with bloody show and contractions every .

## 2012-10-21 NOTE — MAU Note (Signed)
Dr. Thad Ranger notified of pt.

## 2012-10-21 NOTE — Anesthesia Procedure Notes (Signed)
Epidural Patient location during procedure: OB Start time: 10/21/2012 4:28 AM  Staffing Anesthesiologist: Angus Seller., Harrell Gave. Performed by: anesthesiologist   Preanesthetic Checklist Completed: patient identified, site marked, surgical consent, pre-op evaluation, timeout performed, IV checked, risks and benefits discussed and monitors and equipment checked  Epidural Patient position: sitting Prep: site prepped and draped and DuraPrep Patient monitoring: continuous pulse ox and blood pressure Approach: midline Injection technique: LOR air and LOR saline  Needle:  Needle type: Tuohy  Needle gauge: 17 G Needle length: 9 cm and 9 Needle insertion depth: 4 cm Catheter type: closed end flexible Catheter size: 19 Gauge Catheter at skin depth: 10 cm Test dose: negative  Assessment Events: blood not aspirated, injection not painful, no injection resistance, negative IV test and no paresthesia  Additional Notes Patient identified.  Risk benefits discussed including failed block, incomplete pain control, headache, nerve damage, paralysis, blood pressure changes, nausea, vomiting, reactions to medication both toxic or allergic, and postpartum back pain.  Patient expressed understanding and wished to proceed.  All questions were answered.  Sterile technique used throughout procedure and epidural site dressed with sterile barrier dressing. No paresthesia or other complications noted.The patient did not experience any signs of intravascular injection such as tinnitus or metallic taste in mouth nor signs of intrathecal spread such as rapid motor block. Please see nursing notes for vital signs.

## 2012-10-21 NOTE — Progress Notes (Signed)
Jessica Hunter is a 22 y.o. G3P2002 at [redacted]w[redacted]d by LMP admitted for active labor  Subjective: Comfortable with epidural. No complaints currently.  Objective: BP 106/53  Pulse 81  Temp(Src) 98 F (36.7 C) (Oral)  Resp 20  Ht 5\' 6"  (1.676 m)  Wt 72.303 kg (159 lb 6.4 oz)  BMI 25.74 kg/m2  SpO2 100%  LMP 01/19/2012   Total I/O In: -  Out: 600 [Urine:600]  FHT:  FHR: 160 bpm, variability: moderate,  accelerations:  Present,  decelerations:  Absent UC:   regular, every 2-4 minutes SVE:   Dilation: 5.5 Effacement (%): 50 Station: -2 Exam by:: Jessica Hockey, RN  Labs: Lab Results  Component Value Date   WBC 10.9* 10/21/2012   HGB 12.2 10/21/2012   HCT 37.5 10/21/2012   MCV 81.3 10/21/2012   PLT 154 10/21/2012    Assessment / Plan: Augmentation of labor, progressing well  Labor: Progressing normally Preeclampsia: N/A Fetal Wellbeing:  Category I Pain Control:  Epidural Anticipated MOD:  NSVD  Jessica Hunter Other 10/21/2012, 9:41 AM

## 2012-10-21 NOTE — H&P (Signed)
Jessica Hunter is a 22 y.o. female presenting for contractions starting a few days ago but worsening today.  Denies loss of fluid. Has had some blood on wiping. Baby moving well.  [facility] No complications this pregnancy or previous. All vaginal deliveries. 1 hour GTT 80. Quad screen negative. Normal sonos.  Maternal Medical History:  Reason for admission: Contractions.  Nausea.  Contractions: Onset was 2 days ago.   Frequency: regular.   Perceived severity is strong.    Fetal activity: Perceived fetal activity is normal.   Last perceived fetal movement was within the past hour.    Prenatal complications: no prenatal complications Prenatal Complications - Diabetes: none.    OB History   Grav Para Term Preterm Abortions TAB SAB Ect Mult Living   3 2 2       2      Past Medical History  Diagnosis Date  . No pertinent past medical history   . Medical history non-contributory    Past Surgical History  Procedure Laterality Date  . No past surgeries     Family History: family history is negative for Other. Social History:  reports that she has never smoked. She has never used smokeless tobacco. She reports that she does not drink alcohol or use illicit drugs.   Prenatal Transfer Tool  Maternal Diabetes: No Genetic Screening: Normal Maternal Ultrasounds/Referrals: Normal Fetal Ultrasounds or other Referrals:  None Maternal Substance Abuse:  No Significant Maternal Medications:  None Significant Maternal Lab Results:  Lab values include: Group B Strep positive Other Comments:  None  Review of Systems  Constitutional: Negative for fever and chills.  Eyes: Negative for blurred vision and double vision.  Gastrointestinal: Negative for nausea and vomiting.  Neurological: Negative for headaches.    Dilation: 5 Effacement (%): 80 Blood pressure 136/80, pulse 86, temperature 98.1 F (36.7 C), temperature source Oral, resp. rate 20, height 5\' 6"  (1.676 m),  weight 72.303 kg (159 lb 6.4 oz), last menstrual period 01/19/2012.   Fetal Exam Fetal Monitor Review: Mode: ultrasound.   Baseline rate: 145.  Variability: moderate (6-25 bpm).   Pattern: accelerations present and no decelerations.    Fetal State Assessment: Category I - tracings are normal.     Physical Exam  Constitutional: She is oriented to person, place, and time. She appears well-developed and well-nourished. No distress.  HENT:  Head: Normocephalic and atraumatic.  Eyes: Conjunctivae and EOM are normal.  Neck: Normal range of motion. Neck supple.  Cardiovascular: Normal rate, regular rhythm and normal heart sounds.   Respiratory: Effort normal and breath sounds normal. No respiratory distress.  GI: Soft. There is no tenderness. There is no rebound and no guarding.  Musculoskeletal: Normal range of motion. She exhibits no edema and no tenderness.  Neurological: She is alert and oriented to person, place, and time.  Skin: Skin is warm and dry.    Prenatal labs: ABO, Rh: --/--/AB POS (09/23 1710) Antibody:   Rubella:   RPR:    HBsAg:    HIV:    GBS:   positive  Assessment/Plan: 22 y.o. [id number] at [redacted]w[redacted]d with onset of labor.  - Admit to L&D - GBS prophylaxis with ampicillin - Epidural when desired - Anticipate SVD   Napoleon Form 10/21/2012, 2:51 AM

## 2012-10-22 LAB — CBC
HCT: 32.5 % — ABNORMAL LOW (ref 36.0–46.0)
MCHC: 32.3 g/dL (ref 30.0–36.0)
Platelets: 135 10*3/uL — ABNORMAL LOW (ref 150–400)
RDW: 15.4 % (ref 11.5–15.5)

## 2012-10-22 MED ORDER — IBUPROFEN 600 MG PO TABS: 600.0000 mg | ORAL_TABLET | Freq: Four times a day (QID) | ORAL | Status: DC

## 2012-10-22 NOTE — Progress Notes (Signed)
Post Partum Day #1 Subjective: Jessica Hunter is a 22 y.o. female [phone] who had a SVD yesterday at 2pm.  no complaints, up ad lib, voiding, tolerating PO and + flatus.  She denies any dizziness on standing and states her vaginal bleeding is improving.  She has yet to make a bowel movement.  She plans to breast feed exclusively.  She will be receiving the Depo Shot for her birth control needs and [facility].    Objective: Blood pressure 101/58, pulse 78, temperature 98.2 F (36.8 C), temperature source Oral, resp. rate 18, height 5\' 6"  (1.676 m), weight 72.303 kg (159 lb 6.4 oz), last menstrual period 01/19/2012, SpO2 100.00%, unknown if currently breastfeeding.  Physical Exam:  General: alert, cooperative and no distress CVS: RRR, good S1 and S2; no RGM Lungs: CTA bilateral; no wheeze, rales, or rhonchi Abd: normoactive bowel sounds x4, soft and non-tender, non-distended.   Lochia: appropriate Uterine Fundus: firm DVT Evaluation: No evidence of DVT seen on physical exam.  Negative Homan's sign.   Recent Labs  10/21/12 0310 10/22/12 0625  HGB 12.2 10.5*  HCT 37.5 32.5*    Assessment/Plan: Plan for discharge tomorrow if not later this afternoon.     LOS: 1 day   [name] 10/22/2012, 7:49 AM   I examined pt and agree with documentation above and PA-S plan of care. [facility]

## 2012-10-22 NOTE — Anesthesia Postprocedure Evaluation (Signed)
  Anesthesia Post-op Note  Patient: Jessica Hunter  Procedure(s) Performed: * No procedures listed *  Patient Location: Mother/Baby  Anesthesia Type:Epidural  Level of Consciousness: awake, alert , oriented and patient cooperative  Airway and Oxygen Therapy: Patient Spontanous Breathing  Post-op Pain: mild  Post-op Assessment: Patient's Cardiovascular Status Stable, Respiratory Function Stable, No headache, No residual numbness and No residual motor weakness, mild back ache at insertion site  Post-op Vital Signs: stable  Complications: No apparent anesthesia complications

## 2012-10-22 NOTE — Progress Notes (Signed)
Ur chart review completed.  

## 2012-10-22 NOTE — Discharge Summary (Signed)
Obstetric Discharge Summary Reason for Admission: onset of labor Prenatal Procedures: NST and ultrasound Intrapartum Procedures: spontaneous vaginal delivery Postpartum Procedures: none Complications-Operative and Postpartum: none Hemoglobin  Date Value Range Status  10/22/2012 10.5* 12.0 - 15.0 g/dL Final     HCT  Date Value Range Status  10/22/2012 32.5* 36.0 - 46.0 % Final    Physical Exam:  General: alert, cooperative and appears stated age Lochia: appropriate Uterine Fundus: firm Incision: n/a DVT Evaluation: No evidence of DVT seen on physical exam. Negative Homan's sign.   Discharge Diagnoses: Term Pregnancy-delivered  Discharge Information: Date: 10/22/2012 Activity: unrestricted Diet: routine Medications: Ibuprofen Condition: stable Instructions: refer to practice specific booklet Discharge to: home Follow-up Information   Follow up with [facility] In 6 weeks.   Contact information:   [address] [place] [address] [phone]      Newborn Data: Live born female  Birth Weight: 8 lb 5.9 oz (3795 g) APGAR: 8, 9  Home with mother.  [facility] 10/22/2012, 12:12 PM

## 2012-10-22 NOTE — Progress Notes (Deleted)
Ur chart review completed.  

## 2012-10-23 DIAGNOSIS — IMO0001 Reserved for inherently not codable concepts without codable children: Secondary | ICD-10-CM

## 2012-10-23 DIAGNOSIS — O98819 Other maternal infectious and parasitic diseases complicating pregnancy, unspecified trimester: Secondary | ICD-10-CM | POA: Diagnosis present

## 2012-10-23 MED ORDER — MEASLES, MUMPS & RUBELLA VAC ~~LOC~~ INJ
0.5000 mL | INJECTION | Freq: Once | SUBCUTANEOUS | Status: AC
  Administered 2012-10-23: 0.5 mL via SUBCUTANEOUS
  Filled 2012-10-23: qty 0.5

## 2012-10-23 NOTE — Discharge Summary (Signed)
Obstetric Discharge Summary Reason for Admission: onset of labor Prenatal Procedures: NST and ultrasound Intrapartum Procedures: spontaneous vaginal delivery Postpartum Procedures: none Complications-Operative and Postpartum: none  Jessica Hunter is a 22 y.o. female [id number] who presented to the MAU with PROM at [redacted]w[redacted]d.  She denies dizziness upon standing.  She has been tolerating food and drink and has been making urine, bowel movements, and passing flatus regularly.  She plans to breast feed exclusively.  She is planning to start the Depo Shot for her birth control needs and [facility] for her postpartum OBGYN needs.  She has an appointment with [facility] to discuss circumcision for her son.   Delivery Note  At 1:57 PM a viable female was delivered via Vaginal, Spontaneous Delivery (Presentation: Right Occiput Anterior). APGAR: 8, 9; weight .  Placenta status: Intact, Spontaneous. Cord: 3 vessels with the following complications: None. Cord pH: N/A  Anesthesia: Epidural  Episiotomy: None  Lacerations: None  Suture Repair: N/A  Est. Blood Loss (mL): 300  Mom to postpartum. Baby to nursery-stable.  Jessica Hunter  10/21/2012, 2:16 PM  Hemoglobin  Date Value Range Status  10/22/2012 10.5* 12.0 - 15.0 g/dL Final     HCT  Date Value Range Status  10/22/2012 32.5* 36.0 - 46.0 % Final    Physical Exam:  General: alert, cooperative and no distress CVS: RRR, good S1 and S2, no RGM Lungs: CTA bilaterally, no wheeze, rales, or rhonchi ABD: Normoactive bowel sounds x4 quadrants, soft, non-tender, non-distended Lochia: appropriate Uterine Fundus: firm DVT Evaluation: No evidence of DVT seen on physical exam. Negative Homan's sign.  Discharge Diagnoses: Term Pregnancy-delivered  Discharge Information: Date: 10/23/2012 Activity: pelvic rest Diet: routine Medications: PNV, Ibuprofen, Colace and Iron Condition: stable Instructions: refer to  practice specific booklet Discharge to: home Follow-up Information   Follow up with [facility] In 6 weeks.   Contact information:   [address] [place] [address] [phone]      Newborn Data: Live born female  Birth Weight: 8 lb 5.9 oz (3795 g) APGAR: 8, 9  Home with mother.  Jessica Hunter 10/23/2012, 8:56 AM  Seen also by me Agree with note Wynelle Bourgeois CNM

## 2012-10-23 NOTE — Discharge Summary (Signed)

## 2012-10-28 NOTE — H&P (Signed)
Attestation of Attending Supervision of Advanced Practitioner (CNM/NP): Evaluation and management procedures were performed by the Advanced Practitioner under my supervision and collaboration. I have reviewed the Advanced Practitioner's note and chart, and I agree with the management and plan.  Pt needs prenatal labs if not found in chart.  Macarena Langseth H. 1:35 PM

## 2013-02-04 ENCOUNTER — Encounter: Payer: Self-pay | Admitting: *Deleted

## 2013-03-03 ENCOUNTER — Ambulatory Visit (INDEPENDENT_AMBULATORY_CARE_PROVIDER_SITE_OTHER): Payer: Medicaid Other | Admitting: Obstetrics & Gynecology

## 2013-03-03 ENCOUNTER — Encounter: Payer: Self-pay | Admitting: Obstetrics & Gynecology

## 2013-03-03 VITALS — BP 125/84 | Temp 98.2°F | Wt 144.2 lb

## 2013-03-03 DIAGNOSIS — Z8742 Personal history of other diseases of the female genital tract: Secondary | ICD-10-CM

## 2013-03-03 DIAGNOSIS — Z1389 Encounter for screening for other disorder: Secondary | ICD-10-CM

## 2013-03-03 DIAGNOSIS — R6889 Other general symptoms and signs: Secondary | ICD-10-CM

## 2013-03-03 DIAGNOSIS — Z3491 Encounter for supervision of normal pregnancy, unspecified, first trimester: Secondary | ICD-10-CM

## 2013-03-03 DIAGNOSIS — Z8759 Personal history of other complications of pregnancy, childbirth and the puerperium: Secondary | ICD-10-CM

## 2013-03-03 DIAGNOSIS — O09891 Supervision of other high risk pregnancies, first trimester: Secondary | ICD-10-CM

## 2013-03-03 DIAGNOSIS — Z3687 Encounter for antenatal screening for uncertain dates: Secondary | ICD-10-CM

## 2013-03-03 DIAGNOSIS — B951 Streptococcus, group B, as the cause of diseases classified elsewhere: Secondary | ICD-10-CM

## 2013-03-03 DIAGNOSIS — IMO0002 Reserved for concepts with insufficient information to code with codable children: Secondary | ICD-10-CM

## 2013-03-03 DIAGNOSIS — Z3490 Encounter for supervision of normal pregnancy, unspecified, unspecified trimester: Secondary | ICD-10-CM | POA: Insufficient documentation

## 2013-03-03 DIAGNOSIS — O2390 Unspecified genitourinary tract infection in pregnancy, unspecified trimester: Secondary | ICD-10-CM

## 2013-03-03 DIAGNOSIS — O09899 Supervision of other high risk pregnancies, unspecified trimester: Secondary | ICD-10-CM

## 2013-03-03 HISTORY — DX: Personal history of other complications of pregnancy, childbirth and the puerperium: Z87.59

## 2013-03-03 LAB — POCT URINALYSIS DIP (DEVICE)
Glucose, UA: NEGATIVE mg/dL
Nitrite: NEGATIVE
Urobilinogen, UA: 2 mg/dL — ABNORMAL HIGH (ref 0.0–1.0)

## 2013-03-03 NOTE — Patient Instructions (Signed)
Pregnancy - Second Trimester The second trimester of pregnancy (3 to 6 months) is a period of rapid growth for you and your baby. At the end of the sixth month, your baby is about 9 inches long and weighs 1 1/2 pounds. You will begin to feel the baby move between 18 and 20 weeks of the pregnancy. This is called quickening. Weight gain is faster. A clear fluid (colostrum) may leak out of your breasts. You may feel small contractions of the womb (uterus). This is known as false labor or Braxton-Hicks contractions. This is like a practice for labor when the baby is ready to be born. Usually, the problems with morning sickness have usually passed by the end of your first trimester. Some women develop small dark blotches (called cholasma, mask of pregnancy) on their face that usually goes away after the baby is born. Exposure to the sun makes the blotches worse. Acne may also develop in some pregnant women and pregnant women who have acne, may find that it goes away. PRENATAL EXAMS  Blood work may continue to be done during prenatal exams. These tests are done to check on your health and the probable health of your baby. Blood work is used to follow your blood levels (hemoglobin). Anemia (low hemoglobin) is common during pregnancy. Iron and vitamins are given to help prevent this. You will also be checked for diabetes between 24 and 28 weeks of the pregnancy. Some of the previous blood tests may be repeated.  The size of the uterus is measured during each visit. This is to make sure that the baby is continuing to grow properly according to the dates of the pregnancy.  Your blood pressure is checked every prenatal visit. This is to make sure you are not getting toxemia.  Your urine is checked to make sure you do not have an infection, diabetes or protein in the urine.  Your weight is checked often to make sure gains are happening at the suggested rate. This is to ensure that both you and your baby are  growing normally.  Sometimes, an ultrasound is performed to confirm the proper growth and development of the baby. This is a test which bounces harmless sound waves off the baby so your caregiver can more accurately determine due dates. Sometimes, a test is done on the amniotic fluid surrounding the baby. This test is called an amniocentesis. The amniotic fluid is obtained by sticking a needle into the belly (abdomen). This is done to check the chromosomes in instances where there is a concern about possible genetic problems with the baby. It is also sometimes done near the end of pregnancy if an early delivery is required. In this case, it is done to help make sure the baby's lungs are mature enough for the baby to live outside of the womb. CHANGES OCCURING IN THE SECOND TRIMESTER OF PREGNANCY Your body goes through many changes during pregnancy. They vary from person to person. Talk to your caregiver about changes you notice that you are concerned about.  During the second trimester, you will likely have an increase in your appetite. It is normal to have cravings for certain foods. This varies from person to person and pregnancy to pregnancy.  Your lower abdomen will begin to bulge.  You may have to urinate more often because the uterus and baby are pressing on your bladder. It is also common to get more bladder infections during pregnancy. You can help this by drinking lots of fluids   and emptying your bladder before and after intercourse.  You may begin to get stretch marks on your hips, abdomen, and breasts. These are normal changes in the body during pregnancy. There are no exercises or medicines to take that prevent this change.  You may begin to develop swollen and bulging veins (varicose veins) in your legs. Wearing support hose, elevating your feet for 15 minutes, 3 to 4 times a day and limiting salt in your diet helps lessen the problem.  Heartburn may develop as the uterus grows and  pushes up against the stomach. Antacids recommended by your caregiver helps with this problem. Also, eating smaller meals 4 to 5 times a day helps.  Constipation can be treated with a stool softener or adding bulk to your diet. Drinking lots of fluids, and eating vegetables, fruits, and whole grains are helpful.  Exercising is also helpful. If you have been very active up until your pregnancy, most of these activities can be continued during your pregnancy. If you have been less active, it is helpful to start an exercise program such as walking.  Hemorrhoids may develop at the end of the second trimester. Warm sitz baths and hemorrhoid cream recommended by your caregiver helps hemorrhoid problems.  Backaches may develop during this time of your pregnancy. Avoid heavy lifting, wear low heal shoes, and practice good posture to help with backache problems.  Some pregnant women develop tingling and numbness of their hand and fingers because of swelling and tightening of ligaments in the wrist (carpel tunnel syndrome). This goes away after the baby is born.  As your breasts enlarge, you may have to get a bigger bra. Get a comfortable, cotton, support bra. Do not get a nursing bra until the last month of the pregnancy if you will be nursing the baby.  You may get a dark line from your belly button to the pubic area called the linea nigra.  You may develop rosy cheeks because of increase blood flow to the face.  You may develop spider looking lines of the face, neck, arms, and chest. These go away after the baby is born. HOME CARE INSTRUCTIONS   It is extremely important to avoid all smoking, herbs, alcohol, and unprescribed drugs during your pregnancy. These chemicals affect the formation and growth of the baby. Avoid these chemicals throughout the pregnancy to ensure the delivery of a healthy infant.  Most of your home care instructions are the same as suggested for the first trimester of your  pregnancy. Keep your caregiver's appointments. Follow your caregiver's instructions regarding medicine use, exercise, and diet.  During pregnancy, you are providing food for you and your baby. Continue to eat regular, well-balanced meals. Choose foods such as meat, fish, milk and other low fat dairy products, vegetables, fruits, and whole-grain breads and cereals. Your caregiver will tell you of the ideal weight gain.  A physical sexual relationship may be continued up until near the end of pregnancy if there are no other problems. Problems could include early (premature) leaking of amniotic fluid from the membranes, vaginal bleeding, abdominal pain, or other medical or pregnancy problems.  Exercise regularly if there are no restrictions. Check with your caregiver if you are unsure of the safety of some of your exercises. The greatest weight gain will occur in the last 2 trimesters of pregnancy. Exercise will help you:  Control your weight.  Get you in shape for labor and delivery.  Lose weight after you have the baby.  Wear   a good support or jogging bra for breast tenderness during pregnancy. This may help if worn during sleep. Pads or tissues may be used in the bra if you are leaking colostrum.  Do not use hot tubs, steam rooms or saunas throughout the pregnancy.  Wear your seat belt at all times when driving. This protects you and your baby if you are in an accident.  Avoid raw meat, uncooked cheese, cat litter boxes, and soil used by cats. These carry germs that can cause birth defects in the baby.  The second trimester is also a good time to visit your dentist for your dental health if this has not been done yet. Getting your teeth cleaned is okay. Use a soft toothbrush. Brush gently during pregnancy.  It is easier to leak urine during pregnancy. Tightening up and strengthening the pelvic muscles will help with this problem. Practice stopping your urination while you are going to the  bathroom. These are the same muscles you need to strengthen. It is also the muscles you would use as if you were trying to stop from passing gas. You can practice tightening these muscles up 10 times a set and repeating this about 3 times per day. Once you know what muscles to tighten up, do not perform these exercises during urination. It is more likely to contribute to an infection by backing up the urine.  Ask for help if you have financial, counseling, or nutritional needs during pregnancy. Your caregiver will be able to offer counseling for these needs as well as refer you for other special needs.  Your skin may become oily. If so, wash your face with mild soap, use non-greasy moisturizer and oil or cream based makeup. MEDICINES AND DRUG USE IN PREGNANCY  Take prenatal vitamins as directed. The vitamin should contain 1 milligram of folic acid. Keep all vitamins out of reach of children. Only a couple vitamins or tablets containing iron may be fatal to a baby or young child when ingested.  Avoid use of all medicines, including herbs, over-the-counter medicines, not prescribed or suggested by your caregiver. Only take over-the-counter or prescription medicines for pain, discomfort, or fever as directed by your caregiver. Do not use aspirin.  Let your caregiver also know about herbs you may be using.  Alcohol is related to a number of birth defects. This includes fetal alcohol syndrome. All alcohol, in any form, should be avoided completely. Smoking will cause low birth rate and premature babies.  Street or illegal drugs are very harmful to the baby. They are absolutely forbidden. A baby born to an addicted mother will be addicted at birth. The baby will go through the same withdrawal an adult does. SEEK MEDICAL CARE IF:  You have any concerns or worries during your pregnancy. It is better to call with your questions if you feel they cannot wait, rather than worry about them. SEEK IMMEDIATE  MEDICAL CARE IF:   An unexplained oral temperature above 102 F (38.9 C) develops, or as your caregiver suggests.  You have leaking of fluid from the vagina (birth canal). If leaking membranes are suspected, take your temperature and tell your caregiver of this when you call.  There is vaginal spotting, bleeding, or passing clots. Tell your caregiver of the amount and how many pads are used. Light spotting in pregnancy is common, especially following intercourse.  You develop a bad smelling vaginal discharge with a change in the color from clear to white.  You continue to feel   sick to your stomach (nauseated) and have no relief from remedies suggested. You vomit blood or coffee ground-like materials.  You lose more than 2 pounds of weight or gain more than 2 pounds of weight over 1 week, or as suggested by your caregiver.  You notice swelling of your face, hands, feet, or legs.  You get exposed to German measles and have never had them.  You are exposed to fifth disease or chickenpox.  You develop belly (abdominal) pain. Round ligament discomfort is a common non-cancerous (benign) cause of abdominal pain in pregnancy. Your caregiver still must evaluate you.  You develop a bad headache that does not go away.  You develop fever, diarrhea, pain with urination, or shortness of breath.  You develop visual problems, blurry, or double vision.  You fall or are in a car accident or any kind of trauma.  There is mental or physical violence at home. Document Released: 05/15/2001 Document Revised: 02/13/2012 Document Reviewed: 11/17/2008 [employer] Patient Information 2014 [employer].  Breastfeeding A change in hormones during your pregnancy causes growth of your breast tissue and an increase in number and size of milk ducts. The hormone prolactin allows proteins, sugars, and fats from your blood supply to make breast milk in your milk-producing glands. The hormone progesterone prevents  breast milk from being released before the birth of your baby. After the birth of your baby, your progesterone level decreases allowing breast milk to be released. Thoughts of your baby, as well as his or her sucking or crying, can stimulate the release of milk from the milk-producing glands. Deciding to breastfeed (nurse) is one of the best choices you can make for you and your baby. The information that follows gives a brief review of the benefits, as well as other important skills to know about breastfeeding. BENEFITS OF BREASTFEEDING For your baby  The first milk (colostrum) helps your baby's digestive system function better.   There are antibodies in your milk that help your baby fight off infections.   Your baby has a lower incidence of asthma, allergies, and sudden infant death syndrome (SIDS).   The nutrients in breast milk are better for your baby than infant formulas.  Breast milk improves your baby's brain development.   Your baby will have less gas, colic, and constipation.  Your baby is less likely to develop other conditions, such as childhood obesity, asthma, or diabetes mellitus. For you  Breastfeeding helps develop a very special bond between you and your baby.   Breastfeeding is convenient, always available at the correct temperature, and costs nothing.   Breastfeeding helps to burn calories and helps you lose the weight gained during pregnancy.   Breastfeeding makes your uterus contract back down to normal size faster and slows bleeding following delivery.   Breastfeeding mothers have a lower risk of developing osteoporosis or breast or ovarian cancer later in life.  BREASTFEEDING FREQUENCY  A healthy, full-term baby may breastfeed as often as every hour or space his or her feedings to every 3 hours. Breastfeeding frequency will vary from baby to baby.   Newborns should be fed no less than every 2 3 hours during the day and every 4 5 hours during the  night. You should breastfeed a minimum of 8 feedings in a 24 hour period.  Awaken your baby to breastfeed if it has been 3 4 hours since the last feeding.  Breastfeed when you feel the need to reduce the fullness of your breasts or when   your newborn shows signs of hunger. Signs that your baby may be hungry include:  Increased alertness or activity.  Stretching.  Movement of the head from side to side.  Movement of the head and opening of the mouth when the corner of the mouth or cheek is stroked (rooting).  Increased sucking sounds, smacking lips, cooing, sighing, or squeaking.  Hand-to-mouth movements.  Increased sucking of fingers or hands.  Fussing.  Intermittent crying.  Signs of extreme hunger will require calming and consoling before you try to feed your baby. Signs of extreme hunger may include:  Restlessness.  A loud, strong cry.  Screaming.  Frequent feeding will help you make more milk and will help prevent problems, such as sore nipples and engorgement of the breasts.  BREASTFEEDING   Whether lying down or sitting, be sure that the baby's abdomen is facing your abdomen.   Support your breast with 4 fingers under your breast and your thumb above your nipple. Make sure your fingers are well away from your nipple and your baby's mouth.   Stroke your baby's lips gently with your finger or nipple.   When your baby's mouth is open wide enough, place all of your nipple and as much of the colored area around your nipple (areola) as possible into your baby's mouth.  More areola should be visible above his or her upper lip than below his or her lower lip.  Your baby's tongue should be between his or her lower gum and your breast.  Ensure that your baby's mouth is correctly positioned around the nipple (latched). Your baby's lips should create a seal on your breast.  Signs that your baby has effectively latched onto your nipple include:  Tugging or sucking  without pain.  Swallowing heard between sucks.  Absent click or smacking sound.  Muscle movement above and in front of his or her ears with sucking.  Your baby must suck about 2 3 minutes in order to get your milk. Allow your baby to feed on each breast as long as he or she wants. Nurse your baby until he or she unlatches or falls asleep at the first breast, then offer the second breast.  Signs that your baby is full and satisfied include:  A gradual decrease in the number of sucks or complete cessation of sucking.  Falling asleep.  Extension or relaxation of his or her body.  Retention of a small amount of milk in his or her mouth.  Letting go of your breast by himself or herself.  Signs of effective breastfeeding in you include:  Breasts that have increased firmness, weight, and size prior to feeding.  Breasts that are softer after nursing.  Increased milk volume, as well as a change in milk consistency and color by the 5th day of breastfeeding.  Breast fullness relieved by breastfeeding.  Nipples are not sore, cracked, or bleeding.  If needed, break the suction by putting your finger into the corner of your baby's mouth and sliding your finger between his or her gums. Then, remove your breast from his or her mouth.  It is common for babies to spit up a small amount after a feeding.  Babies often swallow air during feeding. This can make babies fussy. Burping your baby between breasts can help with this.  Vitamin D supplements are recommended for babies who get only breast milk.  Avoid using a pacifier during your baby's first 4 6 weeks.  Avoid supplemental feedings of water, formula, or   juice in place of breastfeeding. Breast milk is all the food your baby needs. It is not necessary for your baby to have water or formula. Your breasts will make more milk if supplemental feedings are avoided during the early weeks. HOW TO TELL WHETHER YOUR BABY IS GETTING ENOUGH BREAST  MILK Wondering whether or not your baby is getting enough milk is a common concern among mothers. You can be assured that your baby is getting enough milk if:   Your baby is actively sucking and you hear swallowing.   Your baby seems relaxed and satisfied after a feeding.   Your baby nurses at least 8 12 times in a 24 hour time period.  During the first 3 5 days of age:  Your baby is wetting at least 3 5 diapers in a 24 hour period. The urine should be clear and pale yellow.  Your baby is having at least 3 4 stools in a 24 hour period. The stool should be soft and yellow.  At 5 7 days of age, your baby is having at least 3 6 stools in a 24 hour period. The stool should be seedy and yellow by 5 days of age.  Your baby has a weight loss less than 7 10% during the first 3 days of age.  Your baby does not lose weight after 3 7 days of age.  Your baby gains 4 7 ounces each week after he or she is 4 days of age.  Your baby gains weight by 5 days of age and is back to birth weight within 2 weeks. ENGORGEMENT In the first week after your baby is born, you may experience extremely full breasts (engorgement). When engorged, your breasts may feel heavy, warm, or tender to the touch. Engorgement peaks within 24 48 hours after delivery of your baby.  Engorgement may be reduced by:  Continuing to breastfeed.  Increasing the frequency of breastfeeding.  Taking warm showers or applying warm, moist heat to your breasts just before each feeding. This increases circulation and helps the milk flow.   Gently massaging your breast before and during the feedings. With your fingertips, massage from your chest wall towards your nipple in a circular motion.   Ensuring that your baby empties at least one breast at every feeding. It also helps to start the next feeding on the opposite breast.   Expressing breast milk by hand or by using a breast pump to empty the breasts if your baby is sleepy, or  not nursing well. You may also want to express milk if you are returning to work oryou feel you are getting engorged.  Ensuring your baby is latched on and positioned properly while breastfeeding. If you follow these suggestions, your engorgement should improve in 24 48 hours. If you are still experiencing difficulty, call your lactation consultant or caregiver.  CARING FOR YOURSELF Take care of your breasts.  Bathe or shower daily.   Avoid using soap on your nipples.   Wear a supportive bra. Avoid wearing underwire style bras.  Air dry your nipples for a 3 4minutes after each feeding.   Use only cotton bra pads to absorb breast milk leakage. Leaking of breast milk between feedings is normal.   Use only pure lanolin on your nipples after nursing. You do not need to wash it off before feeding your baby again. Another option is to express a few drops of breast milk and gently massage that milk into your nipples.  Continue   breast self-awareness checks. Take care of yourself.  Eat healthy foods. Alternate 3 meals with 3 snacks.  Avoid foods that you notice affect your baby in a bad way.  Drink milk, fruit juice, and water to satisfy your thirst (about 8 glasses a day).   Rest often, relax, and take your prenatal vitamins to prevent fatigue, stress, and anemia.  Avoid chewing and smoking tobacco.  Avoid alcohol and drug use.  Take over-the-counter and prescribed medicine only as directed by your caregiver or pharmacist. You should always check with your caregiver or pharmacist before taking any new medicine, vitamin, or herbal supplement.  Know that pregnancy is possible while breastfeeding. If desired, talk to your caregiver about family planning and safe birth control methods that may be used while breastfeeding. SEEK MEDICAL CARE IF:   You feel like you want to stop breastfeeding or have become frustrated with breastfeeding.  You have painful breasts or nipples.  Your  nipples are cracked or bleeding.  Your breasts are red, tender, or warm.  You have a swollen area on either breast.  You have a fever or chills.  You have nausea or vomiting.  You have drainage from your nipples.  Your breasts do not become full before feedings by the 5th day after delivery.  You feel sad and depressed.  Your baby is too sleepy to eat well.  Your baby is having trouble sleeping.   Your baby is wetting less than 3 diapers in a 24 hour period.  Your baby has less than 3 stools in a 24 hour period.  Your baby's skin or the white part of his or her eyes becomes more yellow.   Your baby is not gaining weight by 5 days of age. MAKE SURE YOU:   Understand these instructions.  Will watch your condition.  Will get help right away if you are not doing well or get worse. Document Released: 05/21/2005 Document Revised: 02/13/2012 Document Reviewed: 12/26/2011 [employer] Patient Information 2014 [employer].  

## 2013-03-03 NOTE — Progress Notes (Signed)
P - [phone] 

## 2013-03-03 NOTE — Progress Notes (Signed)
  Subjective:    Jessica Hunter is a [id number] at [redacted]w[redacted]d, dated by unsure LMP, who is being seen today for her first obstetrical visit.  Her obstetrical history is significant for postpartum eclampsia after first pregnancy, no other hypertensive disorders.  Also she just had a SVD in 10/21/12, did not use any contraception.  Patient does intend to attempt to breast feed. Plans to use Depo Provera for contraception.  Pregnancy history fully reviewed.  Patient reports no complaints.   Filed Vitals:   03/03/13 0948  BP: 125/84  Temp: 98.2 F (36.8 C)  Weight: 144 lb 3.2 oz (65.409 kg)    HISTORY: OB History  Gravida Para Term Preterm AB SAB TAB Ectopic Multiple Living  4 3 3       3     # Outcome Date GA Lbr Len/2nd Weight Sex Delivery Anes PTL Lv  4 CUR           3 TRM 10/21/12 [redacted]w[redacted]d 23:25 / 00:32 8 lb 5.9 oz (3.795 kg) M SVD EPI  Y  2 TRM 07/06/09    M SVD   Y  1 TRM 07/13/07    F SVD   Y     Comments: Postpartum eclampsia on PPD#6     Past Medical History  Diagnosis Date  . History of postpartum eclampsia in 2009 03/03/2013    Occurred PPD#6, was witness in  [facility] ER, treated with magnesium sulfate and transferred to [facility] where she was admitted for two days (07/19/07 -07/21/07)    Past Surgical History  Procedure Laterality Date  . No past surgeries     Family History  Problem Relation Age of Onset  . Other Neg Hx   . Diabetes Brother     Exam    Uterus:   15-16 week size uterus  Pelvic Exam:    Perineum: No Hemorrhoids, Normal Perineum   Vulva: normal   Vagina:  normal mucosa, normal discharge   Cervix: multiparous appearance   Adnexa: normal adnexa and no mass, fullness, tenderness   Bony Pelvis: average  System: Breast:  normal appearance, no masses or tenderness   Skin: normal coloration and turgor, no rashes   Neurologic: oriented, normal   Extremities: normal strength, tone, and muscle mass   HEENT PERRLA and extra ocular movement intact   Mouth/Teeth mucous  membranes moist, pharynx normal without lesions and dental hygiene good   Neck supple and no masses   Cardiovascular: regular rate and rhythm   Respiratory:  appears well, vitals normal, no respiratory distress, acyanotic, normal RR, neck free of mass or lymphadenopathy, chest clear, no wheezing, crepitations, rhonchi, normal symmetric air entry   Abdomen: soft, non-tender; bowel sounds normal; no masses,  no organomegaly   Urinary: urethral meatus normal     Assessment:    Pregnancy: [id number] Patient Active Problem List   Diagnosis Date Noted  . Supervision of normal pregnancy 03/03/2013  . Short interval between pregnancies complicating pregnancy, antepartum 03/03/2013  . History of postpartum eclampsia in 2009 03/03/2013  . 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 Continue Prenatal vitamins. Problem list reviewed and updated. Genetic Screening discussed; will await dating ultrasound for more discussion Ultrasound discussed; fetal survey: to be ordered later. Follow up in 4 weeks.    Tereso Newcomer, M.D. 03/03/2013

## 2013-03-04 LAB — OBSTETRIC PANEL
Basophils Relative: 0 % (ref 0–1)
Eosinophils Absolute: 0.1 10*3/uL (ref 0.0–0.7)
Hepatitis B Surface Ag: NEGATIVE
Lymphs Abs: 1.8 10*3/uL (ref 0.7–4.0)
MCH: 27.1 pg (ref 26.0–34.0)
Neutrophils Relative %: 76 % (ref 43–77)
Platelets: 248 10*3/uL (ref 150–400)
RBC: 4.65 MIL/uL (ref 3.87–5.11)

## 2013-03-04 LAB — GC/CHLAMYDIA PROBE AMP: GC Probe RNA: NEGATIVE

## 2013-03-06 ENCOUNTER — Encounter: Payer: Self-pay | Admitting: Obstetrics & Gynecology

## 2013-03-06 DIAGNOSIS — O2340 Unspecified infection of urinary tract in pregnancy, unspecified trimester: Secondary | ICD-10-CM | POA: Insufficient documentation

## 2013-03-06 DIAGNOSIS — B951 Streptococcus, group B, as the cause of diseases classified elsewhere: Secondary | ICD-10-CM | POA: Insufficient documentation

## 2013-03-06 LAB — CULTURE, OB URINE: Colony Count: >=100000

## 2013-03-06 MED ORDER — AMOXICILLIN 500 MG PO CAPS: 500.0000 mg | ORAL_CAPSULE | Freq: Three times a day (TID) | ORAL | Status: DC

## 2013-03-06 NOTE — Addendum Note (Signed)
Addended by: Jaynie Collins A on: 03/06/2013 09:45 PM   Modules accepted: Orders

## 2013-03-10 ENCOUNTER — Ambulatory Visit (HOSPITAL_COMMUNITY)
Admission: RE | Admit: 2013-03-10 | Discharge: 2013-03-10 | Disposition: A | Discharge status: Home / Self Care | Payer: Medicaid Other | Source: Ambulatory Visit | Attending: Obstetrics & Gynecology | Admitting: Obstetrics & Gynecology

## 2013-03-10 ENCOUNTER — Other Ambulatory Visit: Payer: Self-pay | Admitting: Obstetrics & Gynecology

## 2013-03-10 ENCOUNTER — Telehealth: Payer: Self-pay | Admitting: *Deleted

## 2013-03-10 DIAGNOSIS — Z3491 Encounter for supervision of normal pregnancy, unspecified, first trimester: Secondary | ICD-10-CM

## 2013-03-10 DIAGNOSIS — Z3687 Encounter for antenatal screening for uncertain dates: Secondary | ICD-10-CM

## 2013-03-10 DIAGNOSIS — O09891 Supervision of other high risk pregnancies, first trimester: Secondary | ICD-10-CM

## 2013-03-10 DIAGNOSIS — Z3689 Encounter for other specified antenatal screening: Secondary | ICD-10-CM | POA: Insufficient documentation

## 2013-03-10 NOTE — Telephone Encounter (Addendum)
Message copied by Jill Side on Tue Mar 10, 2013 11:19 AM ------      Message from: Jaynie Collins A      Created: Fri Mar 06, 2013  9:46 PM       Amoxicillin prescribed for positive GBS in urine. Also will need treatment in labor.  Please call to inform patient of results and recommendations.       ------ Called pt and left message to call the clinic for important test result information.

## 2013-03-11 NOTE — Telephone Encounter (Signed)
Called Jessica Hunter and notified her of + GBS UTI and need for antibiotics- already sent to her pharmacy and will get IV anitbiotics in labor. Patient voices understanding.

## 2013-03-31 ENCOUNTER — Encounter: Payer: Self-pay | Admitting: Obstetrics and Gynecology

## 2013-03-31 ENCOUNTER — Ambulatory Visit (INDEPENDENT_AMBULATORY_CARE_PROVIDER_SITE_OTHER): Payer: Medicaid Other | Admitting: Obstetrics and Gynecology

## 2013-03-31 VITALS — BP 120/75 | Temp 98.0°F | Wt 149.1 lb

## 2013-03-31 DIAGNOSIS — O2390 Unspecified genitourinary tract infection in pregnancy, unspecified trimester: Secondary | ICD-10-CM

## 2013-03-31 DIAGNOSIS — Z8742 Personal history of other diseases of the female genital tract: Secondary | ICD-10-CM

## 2013-03-31 DIAGNOSIS — Z8759 Personal history of other complications of pregnancy, childbirth and the puerperium: Secondary | ICD-10-CM

## 2013-03-31 DIAGNOSIS — IMO0002 Reserved for concepts with insufficient information to code with codable children: Secondary | ICD-10-CM

## 2013-03-31 DIAGNOSIS — O09899 Supervision of other high risk pregnancies, unspecified trimester: Secondary | ICD-10-CM

## 2013-03-31 DIAGNOSIS — Z3492 Encounter for supervision of normal pregnancy, unspecified, second trimester: Secondary | ICD-10-CM

## 2013-03-31 DIAGNOSIS — R6889 Other general symptoms and signs: Secondary | ICD-10-CM

## 2013-03-31 DIAGNOSIS — B951 Streptococcus, group B, as the cause of diseases classified elsewhere: Secondary | ICD-10-CM

## 2013-03-31 DIAGNOSIS — O09892 Supervision of other high risk pregnancies, second trimester: Secondary | ICD-10-CM

## 2013-03-31 LAB — POCT URINALYSIS DIP (DEVICE)
Bilirubin Urine: NEGATIVE
Glucose, UA: NEGATIVE mg/dL
Nitrite: NEGATIVE

## 2013-03-31 MED ORDER — FLUCONAZOLE 150 MG PO TABS: 150.0000 mg | ORAL_TABLET | Freq: Once | ORAL | Status: DC

## 2013-03-31 NOTE — Progress Notes (Signed)
Patient doing well without complaints. Quad screen today. Anatomy ultrasound also ordered. Patient declined pelvic exam. Rx diflucan provided but understands that she will need an exam if symptoms recur or persists

## 2013-03-31 NOTE — Progress Notes (Signed)
Pulse- 90 Patient reports white, thick and itchy d/c, thinks it's a yeast infection

## 2013-04-03 ENCOUNTER — Encounter: Payer: Self-pay | Admitting: Obstetrics and Gynecology

## 2013-04-03 ENCOUNTER — Ambulatory Visit (HOSPITAL_COMMUNITY): Admission: RE | Admit: 2013-04-03 | Payer: Medicaid Other | Source: Ambulatory Visit

## 2013-04-03 ENCOUNTER — Other Ambulatory Visit: Payer: Self-pay | Admitting: Obstetrics and Gynecology

## 2013-04-03 ENCOUNTER — Ambulatory Visit (HOSPITAL_COMMUNITY)
Admission: RE | Admit: 2013-04-03 | Discharge: 2013-04-03 | Disposition: A | Discharge status: Home / Self Care | Payer: Medicaid Other | Source: Ambulatory Visit | Attending: Obstetrics and Gynecology | Admitting: Obstetrics and Gynecology

## 2013-04-03 DIAGNOSIS — Z363 Encounter for antenatal screening for malformations: Secondary | ICD-10-CM | POA: Insufficient documentation

## 2013-04-03 DIAGNOSIS — Z1389 Encounter for screening for other disorder: Secondary | ICD-10-CM | POA: Insufficient documentation

## 2013-04-03 DIAGNOSIS — O358XX0 Maternal care for other (suspected) fetal abnormality and damage, not applicable or unspecified: Secondary | ICD-10-CM | POA: Insufficient documentation

## 2013-04-03 DIAGNOSIS — Z3492 Encounter for supervision of normal pregnancy, unspecified, second trimester: Secondary | ICD-10-CM

## 2013-04-03 LAB — AFP, QUAD SCREEN
HCG, Total: 13444 m[IU]/mL
MoM for AFP: 0.67
MoM for hCG: 0.59
Open Spina bifida: NEGATIVE
Tri 18 Scr Risk Est: NEGATIVE
uE3 Value: 0.8 ng/mL

## 2013-04-05 ENCOUNTER — Encounter: Payer: Self-pay | Admitting: Obstetrics and Gynecology

## 2013-04-06 ENCOUNTER — Telehealth: Payer: Self-pay

## 2013-04-06 DIAGNOSIS — O283 Abnormal ultrasonic finding on antenatal screening of mother: Secondary | ICD-10-CM

## 2013-04-06 NOTE — Telephone Encounter (Signed)
MFM genetic counseling for November 6th @ 1pm.  Called pt and informed pt that her US showed some abnormalitites and an appt has been scheduled for above time so that they will discuss with her what those results mean and if she has any questions to bring it to her appt.  Pt stated understanding.

## 2013-04-06 NOTE — Telephone Encounter (Signed)
Message copied by Faythe Casa on Mon Apr 06, 2013  4:26 PM ------      Message from: Catalina Antigua      Created: Sun Apr 05, 2013  4:39 PM       Please schedule genetic counseling with MFM this week. Some abnormalities on ultrasound were noted involving fetal heart (echogenic focus) and fetal brain (bilateral choroid plexus cyst). Although individually benign and self resolving, the presence of the two warrants further counseling and if the patient desires, further testing.            Thanks             Clinical cytogeneticist ------

## 2013-04-09 ENCOUNTER — Ambulatory Visit (HOSPITAL_COMMUNITY)
Admission: RE | Admit: 2013-04-09 | Discharge: 2013-04-09 | Disposition: A | Discharge status: Home / Self Care | Payer: Medicaid Other | Source: Ambulatory Visit | Attending: Obstetrics and Gynecology | Admitting: Obstetrics and Gynecology

## 2013-04-09 ENCOUNTER — Other Ambulatory Visit: Payer: Self-pay

## 2013-04-09 DIAGNOSIS — O358XX0 Maternal care for other (suspected) fetal abnormality and damage, not applicable or unspecified: Secondary | ICD-10-CM | POA: Insufficient documentation

## 2013-04-09 DIAGNOSIS — IMO0002 Reserved for concepts with insufficient information to code with codable children: Secondary | ICD-10-CM | POA: Insufficient documentation

## 2013-04-09 NOTE — Progress Notes (Signed)
Genetic Counseling  High-Risk Gestation Note  Appointment Date:  04/09/2013 Referred By: Jessica Antigua, MD Date of Birth:  [birth year] Partner:  Jessica Hunter    Pregnancy History: [id number] Estimated Date of Delivery: 09/08/13 Estimated Gestational Age: [redacted]w[redacted]d Attending: Alpha Gula, MD   I met with [name] and her partner, Mr. Jessica Hunter, for genetic counseling because of previous ultrasound findings.  [name] was sent for ultrasound and consultation today regarding abnormal ultrasound findings visualized by ultrasound performed in [facility] on 04/03/13.  We reviewed the ultrasound findings which visualized bilateral choroid plexus cysts and an echogenic intracardiac focus. Visualized fetal anatomy appeared normal. However, fetal palate and aortic arch were reportedly suboptimally visualized. A follow-up ultrasound was recommended in 4- 6 weeks to complete fetal anatomic survey.   We discussed that the second trimester genetic sonogram is targeted at identifying features associated with aneuploidy.  It has evolved as a screening tool used to provide an individualized risk assessment for Down syndrome and other trisomies.  The ability of sonography to aid in the detection of aneuploidies relies on identification of both major structural anomalies and "soft markers."  The patient was counseled that the latter term refers to findings that are often normal variants and do not cause any significant medical problems.  Nonetheless, these markers have a known association with aneuploidy.    The patient was counseled that an EIF is characterized by calcified papillary muscle leading to a discreet dot in the left, or less commonly, the right ventricle.  We discussed that this finding is typically considered to be a benign variant; however, the risk of aneuploidy is increased when this marker is found in patients who have additional risk factors for fetal aneuploidy (AMA,  abnormal screening test, or other markers or anomalies by fetal ultrasound).   We then discussed that te choroid plexus is an area in the brain where cerebral spinal fluid, the fluid that bathes the brain and spinal cord, is made.  Cysts, or fluid filled sacs, are sometimes found in the choroid plexus of babies both before and after they are born.  Approximately 1% of pregnancies evaluated by ultrasound will show these cysts.  The significance of these cysts remains unclear, although it is believed that in many cases they are a normal variation of development.  It appears that there may be a slightly increased risk of a chromosome condition, particularly Trisomy 18, associated with these cysts when they are seen with other ultrasound findings or in the presence of additional risk factors for fetal aneuploidy.  We reviewed the results of [name]'s Quad screen and the associated reduction in risks for Down syndrome (1 in 1,140 to 1 in 9,790), trisomy 18 (1 in 26,800), and open neural tube defects. We reviewed that the finding of an EIF may be associated with, at most, a risk of ~ 1 in 4,895 for fetal Down syndrome, which is less than the patient's a priori age related risk.  We discussed that the finding of choroid plexus cysts would be associated with a risk of fetal Trisomy 18 up to 1 in 2,977.   We reviewed chromosomes, nondisjunction, and the common features and variable prognoses of Down syndrome and Trisomy 18. We reviewed other available screening and diagnostic options including noninvasive prenatal screening (NIPS)/cell free fetal DNA (cffDNA) testing, and amniocentesis.  She was counseled regarding the benefits and limitations of each option.  We reviewed the approximate 1 in 300-500 risk for complications  for amniocentesis, including spontaneous pregnancy loss. We reviewed that based on the patient's age, Jessica Hunter screen results, and ultrasound findings, the risk for fetal aneuploidy is less than the risk  of complications associated with amniocentesis. After consideration of all the options, she elected to proceed with NIPS (Panorama) at the time of today's visit.  Those results will be available in 8-10 days.  The patient declined amniocentesis.   [name] was provided with written information regarding sickle cell anemia (SCA) including the carrier frequency and incidence in the African-American population, the availability of carrier testing and prenatal diagnosis if indicated.  In addition, we discussed that hemoglobinopathies are routinely screened for as part of the [place] newborn screening panel.  Hemoglobin electrophoresis is available to her, if desired to screen for hemoglobin variants.  Both family histories were reviewed and found to be contributory for a maternal half-brother to the patient with intellectual and developmental delays. He reportedly passed away at age 61 years old. He was described to have his hands contracted and require feeding through a feeding tube. He reportedly was not able to walk or talk. The patient had very limited information regarding her half-brother's condition. The specific etiology was not known. [name] was counseled that there are many different causes of intellectual disabilities including environmental, multifactorial, and genetic etiologies.  We discussed that a specific diagnosis for intellectual disability can be determined in approximately 50% of these individuals.  In the remaining 50% of individuals, a diagnosis may never be determined.  Regarding genetic causes, we discussed that chromosome aberrations (aneuploidy, deletions, duplications, insertions, and translocations) are responsible for a small percentage of individuals with intellectual disability.  Many individuals with chromosome aberrations have additional differences, including congenital anomalies or minor dysmorphisms.  Likewise, single gene conditions are the underlying cause of  intellectual delay in some families.  We discussed that many gene conditions have intellectual disability as a feature, but also often include other physical or medical differences.  We are available to review medical records, if desired. We discussed that without more specific information, it is difficult to provide an accurate risk assessment.  Further genetic counseling is warranted if more information is obtained.  [name] denied exposure to environmental toxins or chemical agents. She denied the use of alcohol, tobacco or street drugs. She denied significant viral illnesses during the course of her pregnancy. Her medical and surgical histories were noncontributory.   I counseled this couple regarding the above risks and available options.  The approximate face-to-face time with the genetic counselor was 40 minutes.  Jessica Hunter Ellsworth Waldschmidt 04/09/2013 3:29 PM

## 2013-04-20 ENCOUNTER — Telehealth (HOSPITAL_COMMUNITY): Payer: Self-pay | Admitting: MS"

## 2013-04-20 NOTE — Telephone Encounter (Signed)
Called Jessica Hunter to discuss her cell free fetal DNA test results.  Mrs. Jessica Hunter had Panorama testing through [facility].  Testing was offered because of previous ultrasound findings.   The patient was identified by name and DOB.  We reviewed that these are within normal limits, showing a less than 1 in 10,000 risk for trisomies 21, 18 and 13, and monosomy X (Turner syndrome).  In addition, the risk for triploidy/vanishing twin and sex chromosome trisomies (47,XXX and 47,XXY) was also low risk.  We reviewed that this testing identifies > 99% of pregnancies with trisomy 41, trisomy 47, sex chromosome trisomies (47,XXX and 47,XXY), and triploidy. The detection rate for trisomy 18 is 96%.  The detection rate for monosomy X is ~92%.  The false positive rate is <0.1% for all conditions. Testing was also consistent with female gender.  She understands that this testing does not identify all genetic conditions.  All questions were answered to her satisfaction, she was encouraged to call with additional questions or concerns.  Quinn Plowman, MS Certified Genetic Counselor 04/20/2013 10:02 AM

## 2013-04-22 ENCOUNTER — Encounter: Payer: Self-pay | Admitting: *Deleted

## 2013-04-22 DIAGNOSIS — Z3490 Encounter for supervision of normal pregnancy, unspecified, unspecified trimester: Secondary | ICD-10-CM

## 2013-04-28 ENCOUNTER — Ambulatory Visit (INDEPENDENT_AMBULATORY_CARE_PROVIDER_SITE_OTHER): Payer: Medicaid Other | Admitting: Obstetrics & Gynecology

## 2013-04-28 VITALS — BP 133/78 | Wt 160.1 lb

## 2013-04-28 DIAGNOSIS — Z3492 Encounter for supervision of normal pregnancy, unspecified, second trimester: Secondary | ICD-10-CM

## 2013-04-28 DIAGNOSIS — O2390 Unspecified genitourinary tract infection in pregnancy, unspecified trimester: Secondary | ICD-10-CM

## 2013-04-28 LAB — POCT URINALYSIS DIP (DEVICE)
Bilirubin Urine: NEGATIVE
Nitrite: NEGATIVE
Specific Gravity, Urine: 1.025 (ref 1.005–1.030)
pH: 7 (ref 5.0–8.0)

## 2013-04-28 NOTE — Progress Notes (Signed)
Pt with no problems.  Difficulty taking PNV.  Told to tak echildrens vit 2/day F/u 4 weeks  1 hour GTT next visit

## 2013-04-28 NOTE — Patient Instructions (Signed)
Prenatal Vitamin and Mineral Combinations (oral solid dosage forms) What is this medicine? PRENATAL VITAMIN AND MINERAL combinations are used before, during, and after pregnancy to help provide provide good nutrition. This medicine may be used for other purposes; ask your health care provider or pharmacist if you have questions. COMMON BRAND NAME(S): Active OB, Advanced Care Plus, Advanced NatalCare, Advanced-RF NatalCare, Aminate Fe, Anemagen OB, B-Nexa, BP FoliNatal Plus B, BP MultiNatal Plus , BP MultiNatal Plus Chewable, BP Prenate, Brainstrong, Bright Beginnings Prenatal, Cal-Nate , CareNatal DHA, CareNate 600 , Cavan One Omega , Taiwanavan Prenatal with EC Calcium , Cavan-Alpha , Cavan-EC SOD DHA, Cavan-Heme OB, Cavan-Heme Omega, Cenogen Ultra, Administrator, sportsCentrum Specialist Prenatal, CertaVite with Antioxidants, Choice-OB + DHA, Citracal Prenatal + DHA, Citracal Prenatal, CitraNatal 90 DHA, CitraNatal Assure, CitraNatal B-Calm, CitraNatal DHA, CitraNatal Harmony, CitraNatal Rx, Classic Prenatal, ComBi Rx , Complete Natal DHA, Complete-RF , CompleteNate, Concept DHA, Concept OB, CoreNate-DHA, Corvite FE with Quatrefolic, CRNatal DHA, Daily Vitamin , Docosavit, Duet Chewable, Duet DHA 400, Duet DHA 430ec, Duet DHA Balanced, Duet DHA Complete Gluten Free, Duet DHA Complete, Duet DHA EC, Duet DHA Ferrazone, EC Omega-3, Duet DHA, Duet, DuoVit DHA, Edge OB, Elite OB , Elite OB with DHA, Elite-OB 400, Extra-Virt Plus, Femecal OB Plus DHA, Femecal OB, Ferrocite Plus, Folbecal , Folcal DHA , [address], [address] Omega 3 , FieldbrookFolivane OB, Folivane-EC Calcium DHA NF, Foltabs 90 Plus DHA, Foltabs Prenatal Plus DHA, Foltabs Prenatal, Gesticare DHA , Gesticare DHA Delayed-Release , Gesticare, HemeNatal OB + DHA, HemeNatal OB, Hemocyte Plus, HIP Prenatal, ICAR Prenatal Rx, iNatal Advance , iNatal GT, iNatal Ultra, Infanate Balance, Infanate DHA , Kolnatal , Lactocal-F, Levomefolate PNV, MACNATAL CN DHA, Marnatal-F , Marnatal-F Plus,  Materna, Maxinate, Mission Prenatal , Mission Prenatal F.A., Mission Prenatal H.P., Mom's Choice Rx, Multi-Nate 30 , Multi-Nate 30 DHA , Multi-Nate DHA Extra, Multifol Plus, Multivitamin With Minerals , MyNatal OB Prenatal, NataCaps, NataChew, NataFolic-OB, Natafort, Natal-V RX, NatalCare CFe 60, NatalCare GlossTabs, NatalCare PIC , C.H. Robinson WorldwideatalCare PIC Forte , United ParcelatalCare Plus , Schering-PloughatalCare Rx, Air Products and ChemicalsatalCare Three, NATALVIRT 90 DHA, NATALVIRT CA, NataTab CFe, NataTab FA, Natatab Rx , Natelle C, Natelle One, Hess Corporationatelle Plus with DHA, Natelle Prefer, Natelle, Natelle-ez, Navatab + DHA, Neevo DHA , Neevo, Nestabs ABC, Nestabs CBF, Nestabs DHA, Nestabs FA, Nestabs Rx, Nestabs, New Advanced Formula Prenatal Z , Nexa Plus, Nexa Select, Niferex-PN Forte, Niferex-PN, NovaNatal, NovaStart, Nu-Natal , NutraCare , Nutri-Tab OB + DHA, Nutri-Tab OB, Nutrinate , NutriSpire, O-Cal F.A. , O-Cal Prenatal , OB Choice, OB Complete 400, OB Complete One , OB Complete Petite, OB Complete Premier, OB Complete with DHA , OB Complete, OB-Natal One, Obstetrix-100 , Obtrex DHA, Obtrex, One-A-Day Women's Prenatal, One-A-Day Women's, OptiNate, Paire OB Tablet Plus DHA, PNV OB + DHA, PNV Prenatal Plus Multivitamin, PNV Prenatal, PNV-DHA + Docusate, PNV-DHA , PNV-DHA Plus , PNV-First, PNV-Iron, PNV-OB with DHA, PNV-Omega, PNV-Select, PNV-Total with DHA , PR Serbiaatal 400, PR Serbiaatal 400ec, PR Serbiaatal 430, PR Serbiaatal 430ec, PR Serbiaatal 440ec, PreCare, PreferaOB + DHA, PreferaOB One, PreferaOB, Premesis Rx, [address] PEARL, [address]rena[address] Plus, PrenaCare, BartlettPrenafirst, Prenaissance 90 DHA, Prenaissance Balance, Prenaissance DHA, Prenaissance Harmony DHA, Prenaissance Next, Prenaissance Plus , Prenaissance Promise, Prenaissance, PrenaPlus, Prenat with Quatrefolic, PreNata Multivitamin with Iron, Prenatabs CBF, Prenatabs FA, Prenatabs OBN, Prenatabs RX, Prenatal 1 Plus 1 , Prenatal 19, Prenatal AD, Prenatal Formula 3, Prenatal H, Prenatal Low Iron, Prenatal MR 90 Fe, Prenatal MTR with  Selenium, Prenatal Optima Advance , Prenatal Plus Iron, Prenatal Plus, Prenatal Rx with Beta  Carotene, Prenatal S, Prenatal U , Prenatal Vitamin , PreNatal Vitamins Plus , Prenatal, Prenate Advance, Prenate DHA, Prenate Elite, Prenate Enhance with [employer] Life, [address] Essential , Prenate GT, Prenate Mini, PreNate Plus, Prenate Restore with Quatrefolic, Prenate Ultra, Prenavite , Prenavite Protein , PreNexa Premier , PreNexa, PreQue, Previte Rx , [employer]ation, PrimaCare ONE, PrimaCare, Provida OB, PruEt DHA , PruEt DHAec, PureFe OB Plus , PureFe Plus, PureVit DualFe Plus , RE DualVit OB , RE DualVit Plus, RE OB + DHA, RE OB 90 + DHA, RE Prenatal, RE PreVit + DHA , RE-Nata 29 OB, RE-Nata 29, Reaphrim, Renate DHA Extra, Renate DHA, Renate, REocyte Plus, Right Step, Rovin-Nv DHA, Rovin-Nv, Se-Care Conceive, [address], [place], Se-Natal 19 , Se-Natal 19 Chewable, Se-Natal 90, Se-Natal ONE, Se-Plete DHA , Se-Tan DHA, Se-Tan Plus, Select-OB + DHA, Select-OB, SetonET, SetonET-EC DHA, StrongStart Chewable Tablet, StrongStart, Stuart One, Stuart Prenatal + DHA, Stuart Prenatal, Stuartnatal Plus 3, Tandem DHA, Tandem OB, Tandem Plus, Taron A Prenatal Pack with DHA, Taron EC Calcium DHA Pack, Taron Prenatal with DHA, Taron-C DHA, Taron-EC Cal , Taron-Prex Prenatal with DHA, Thera Serbia Complete, Thera [place] Core Nutrition, Thera [place] Lactation Support, Thera-Tabs, TL-Care DHA, TL-Select , TL-Select DHA, Tri Rx, TriAdvance, TriCare Prenatal DHA ONE , TriCare, Trifera OB, Trimesis Rx, Trinatal GT, [address] Rx 1 , Trinate , Triveen-Duo DHA, Triveen-PRx RNF, Triveen-Ten, Trust Harley[employer], UltimateCare Advantage, Economist Combo, UltimateCare ONE NF, UltimateCare ONE, Coca Cola , VemaVite-PRx 2, Vena-Bal DHA, Verotin-BY, Verotin-GR, Vinacal B, Vinacal, Vinatal Forte, Vinate 90, Vinate AZ , Vinate AZ Extra, Vinate C , Vinate Calcium, Vinate Care, Vinate DHA, Vinate GT, Vinate IC , Novelty II, [place] III, Vinate M  Low Iron, Vinate One, Vinate PN, Vinate Ultra, Virt-PN DHA, Virt-PN Plus, Virt-PN, Virt-Select, Vitafol PN, Vitafol Ultra, Vitafol-Nano Prenatal, Vitafol-OB + DHA, Vitafol-OB and DHA, Vitafol-OB, Vitafol-One , VitaMed MD Plus Rx , VitaNatal OB Plus DHA, vitaPearl Prenatal, VitaPhil + DHA 90, VitaPhil + DHA, VitaPhil AiDE , VitaPhil, VitaSpire, Viva CT, VIVA DHA, Vol-Tab Rx, VP-CH-PNV, VP-GGR-B6 Prenatal, VP-PNV-DHA, Zatean-CH, Zatean-Pn DHA, Zatean-Pn Plus, Zatean-Pn, Zingiber What should I tell my health care provider before I take this medicine? They need to know if you have any of these conditions: -bleeding or clotting disorder -history of anemia of any type -other chronic health condition -an unusual or allergic reaction to vitamins, minerals, other medicines, foods, dyes, or preservatives How should I use this medicine? Take this medicine by mouth with a glass of water. You can take it with or without food. If it upsets your stomach, take it with food. Chewable prenatal vitamin tablets may be chewed completely before swallowing. Follow the directions on the prescription label. The usual dose is taken once a day. Do not take your medicine more often than directed. Contact your pediatrician regarding the use of this medicine in children. Special care may be needed. This medicine is intended for females who are pregnant, breast-feeding, or may become pregnant. Overdosage: If you think you have taken too much of this medicine contact a poison control center or emergency room at once. NOTE: This medicine is only for you. Do not share this medicine with others. What if I miss a dose? If you miss a dose, take it as soon as you can. If it is almost time for your next dose, take only that dose. Do not take double or extra doses. What may interact with this medicine? -alendronate -antacids -cefdinir -cefditoren -etidronate -fluoroquinolone antibiotics (examples: ciprofloxacin, gatifloxacin,  levofloxacin) -ibandronate -  levodopa -risedronate -tetracycline antibiotics (examples: doxycycline, minocycline, tetracycline) -thyroid hormones -warfarin This list may not describe all possible interactions. Give your health care provider a list of all the medicines, herbs, non-prescription drugs, or dietary supplements you use. Also tell them if you smoke, drink alcohol, or use illegal drugs. Some items may interact with your medicine. What should I watch for while using this medicine? See your health care professional for regular checks on your progress. Remember that vitamin and mineral supplements do not replace the need for good nutrition from a balanced diet. Stools commonly change color when vitamins and minerals are taken. Notify your health care professional if this change is alarming or accompanied by other symptoms, like abdominal pain. What side effects may I notice from receiving this medicine? Side effects that you should report to your doctor or health care professional as soon as possible: -allergic reaction such as skin rash or difficulty breathing -vomiting Side effects that usually do not require medical attention (report to your doctor or health care professional if they continue or are bothersome): -nausea -stomach upset This list may not describe all possible side effects. Call your doctor for medical advice about side effects. You may report side effects to FDA at 1-800-FDA-1088. Where should I keep my medicine? Keep out of the reach of children. Most vitamins and minerals should be stored at controlled room temperature. Check your specific product directions. Protect from heat and moisture. Throw away any unused medicine after the expiration date. NOTE: This sheet is a summary. It may not cover all possible information. If you have questions about this medicine, talk to your doctor, pharmacist, or health care provider.  2014, Elsevier/Gold Standard. (2011-03-30  16:48:05)

## 2013-04-28 NOTE — Progress Notes (Signed)
Pulse: 82 Pt would like a rx for a different pnv because the one she has hurts her stomach.  When patient is standing for a while she gets hot and sees spots. Pt also reports frequent headaches.

## 2013-05-27 ENCOUNTER — Ambulatory Visit (INDEPENDENT_AMBULATORY_CARE_PROVIDER_SITE_OTHER): Payer: Medicaid Other | Admitting: Family

## 2013-05-27 ENCOUNTER — Encounter: Payer: Self-pay | Admitting: Obstetrics and Gynecology

## 2013-05-27 VITALS — BP 137/84 | Wt 165.9 lb

## 2013-05-27 DIAGNOSIS — O2390 Unspecified genitourinary tract infection in pregnancy, unspecified trimester: Secondary | ICD-10-CM

## 2013-05-27 DIAGNOSIS — Z3490 Encounter for supervision of normal pregnancy, unspecified, unspecified trimester: Secondary | ICD-10-CM

## 2013-05-27 LAB — POCT URINALYSIS DIP (DEVICE)
Hgb urine dipstick: NEGATIVE
Ketones, ur: NEGATIVE mg/dL
Protein, ur: NEGATIVE mg/dL
pH: 6.5 (ref 5.0–8.0)

## 2013-05-27 NOTE — Progress Notes (Signed)
Doing well; no questions or concerns.  Scheduled follow-up anatomy ultrasound in one week.

## 2013-05-27 NOTE — Progress Notes (Signed)
Pulse: 110 

## 2013-06-04 NOTE — L&D Delivery Note (Signed)
Attestation of Attending Supervision of Advanced Practitioner (CNM/NP): Evaluation and management procedures were performed by the Advanced Practitioner under my supervision and collaboration. I have reviewed the Advanced Practitioner's note and chart, and I agree with the management and plan.  [name] 7:11 PM   

## 2013-06-04 NOTE — L&D Delivery Note (Signed)
Delivery Note Pt was noted to be C/C/+1 with an urge to push around 11:50.  AROM with clear fluid.  After a 3 minute 2nd stage, at 11:58 PM a viable female was delivered via Vaginal, Spontaneous Delivery (Presentation: Right Occiput Anterior).  APGAR: 8/9, ; weight pending.  After 3 minutes, the cord was double clamped and cut. The placenta separated spontaneously and delivered via CCT.   Placenta status: Intact, Spontaneous.  Cord: 3 vessels with the following complications: loose nuchal cord, reduced before delivery  Anesthesia: Epidural  Episiotomy: None Lacerations: None Suture Repair: n/a Est. Blood Loss (mL): 250  Mom to postpartum.  Baby to Couplet care / Skin to Skin.  Hunter,Jessica Gras 08/28/2013, 12:13 AM

## 2013-06-05 ENCOUNTER — Ambulatory Visit (HOSPITAL_COMMUNITY): Payer: Medicaid Other

## 2013-06-17 ENCOUNTER — Encounter: Payer: Self-pay | Admitting: Family

## 2013-06-17 ENCOUNTER — Encounter: Payer: Medicaid Other | Admitting: Family

## 2013-06-17 ENCOUNTER — Ambulatory Visit (HOSPITAL_COMMUNITY)
Admission: RE | Admit: 2013-06-17 | Discharge: 2013-06-17 | Disposition: A | Discharge status: Home / Self Care | Payer: Medicaid Other | Source: Ambulatory Visit | Attending: Family | Admitting: Family

## 2013-06-17 DIAGNOSIS — O350XX0 Maternal care for (suspected) central nervous system malformation in fetus, not applicable or unspecified: Secondary | ICD-10-CM | POA: Insufficient documentation

## 2013-06-17 DIAGNOSIS — Z3689 Encounter for other specified antenatal screening: Secondary | ICD-10-CM | POA: Insufficient documentation

## 2013-06-17 DIAGNOSIS — O358XX0 Maternal care for other (suspected) fetal abnormality and damage, not applicable or unspecified: Secondary | ICD-10-CM | POA: Insufficient documentation

## 2013-06-17 DIAGNOSIS — O3500X0 Maternal care for (suspected) central nervous system malformation or damage in fetus, unspecified, not applicable or unspecified: Secondary | ICD-10-CM | POA: Insufficient documentation

## 2013-06-17 DIAGNOSIS — Z3490 Encounter for supervision of normal pregnancy, unspecified, unspecified trimester: Secondary | ICD-10-CM

## 2013-06-24 ENCOUNTER — Encounter: Payer: Self-pay | Admitting: Obstetrics and Gynecology

## 2013-06-24 ENCOUNTER — Ambulatory Visit (INDEPENDENT_AMBULATORY_CARE_PROVIDER_SITE_OTHER): Payer: Medicaid Other | Admitting: Obstetrics and Gynecology

## 2013-06-24 VITALS — BP 123/76 | Temp 98.2°F | Wt 171.5 lb

## 2013-06-24 DIAGNOSIS — Z3490 Encounter for supervision of normal pregnancy, unspecified, unspecified trimester: Secondary | ICD-10-CM

## 2013-06-24 DIAGNOSIS — O2390 Unspecified genitourinary tract infection in pregnancy, unspecified trimester: Secondary | ICD-10-CM

## 2013-06-24 DIAGNOSIS — Z3493 Encounter for supervision of normal pregnancy, unspecified, third trimester: Secondary | ICD-10-CM

## 2013-06-24 DIAGNOSIS — O09899 Supervision of other high risk pregnancies, unspecified trimester: Secondary | ICD-10-CM

## 2013-06-24 LAB — POCT URINALYSIS DIP (DEVICE)
BILIRUBIN URINE: NEGATIVE
Glucose, UA: NEGATIVE mg/dL
HGB URINE DIPSTICK: NEGATIVE
Ketones, ur: NEGATIVE mg/dL
Nitrite: NEGATIVE
Protein, ur: NEGATIVE mg/dL
Specific Gravity, Urine: 1.02 (ref 1.005–1.030)
Urobilinogen, UA: 0.2 mg/dL (ref 0.0–1.0)
pH: 7 (ref 5.0–8.0)

## 2013-06-24 LAB — CBC
HCT: 33.2 % — ABNORMAL LOW (ref 36.0–46.0)
Hemoglobin: 11.2 g/dL — ABNORMAL LOW (ref 12.0–15.0)
MCH: 25.9 pg — AB (ref 26.0–34.0)
MCHC: 33.7 g/dL (ref 30.0–36.0)
MCV: 76.7 fL — AB (ref 78.0–100.0)
Platelets: 205 10*3/uL (ref 150–400)
RBC: 4.33 MIL/uL (ref 3.87–5.11)
RDW: 14.6 % (ref 11.5–15.5)
WBC: 9.9 10*3/uL (ref 4.0–10.5)

## 2013-06-24 NOTE — Progress Notes (Signed)
28 wks labs done.  Left groin pain. No UCs.  RLP discussed. US result reviewed> CPS resolved. Has EIF. 47th%ile

## 2013-06-24 NOTE — Patient Instructions (Signed)
Third Trimester of Pregnancy  The third trimester is from week 29 through week 42, months 7 through 9. The third trimester is a time when the fetus is growing rapidly. At the end of the ninth month, the fetus is about 20 inches in length and weighs 6 10 pounds.   BODY CHANGES  Your body goes through many changes during pregnancy. The changes vary from woman to woman.    Your weight will continue to increase. You can expect to gain 25 35 pounds (11 16 kg) by the end of the pregnancy.   You may begin to get stretch marks on your hips, abdomen, and breasts.   You may urinate more often because the fetus is moving lower into your pelvis and pressing on your bladder.   You may develop or continue to have heartburn as a result of your pregnancy.   You may develop constipation because certain hormones are causing the muscles that push waste through your intestines to slow down.   You may develop hemorrhoids or swollen, bulging veins (varicose veins).   You may have pelvic pain because of the weight gain and pregnancy hormones relaxing your joints between the bones in your pelvis. Back aches may result from over exertion of the muscles supporting your posture.   Your breasts will continue to grow and be tender. A yellow discharge may leak from your breasts called colostrum.   Your belly button may stick out.   You may feel short of breath because of your expanding uterus.   You may notice the fetus "dropping," or moving lower in your abdomen.   You may have a bloody mucus discharge. This usually occurs a few days to a week before labor begins.   Your cervix becomes thin and soft (effaced) near your due date.  WHAT TO EXPECT AT YOUR PRENATAL EXAMS   You will have prenatal exams every 2 weeks until week 36. Then, you will have weekly prenatal exams. During a routine prenatal visit:   You will be weighed to make sure you and the fetus are growing normally.   Your blood pressure is taken.   Your abdomen will be  measured to track your baby's growth.   The fetal heartbeat will be listened to.   Any test results from the previous visit will be discussed.   You may have a cervical check near your due date to see if you have effaced.  At around 36 weeks, your caregiver will check your cervix. At the same time, your caregiver will also perform a test on the secretions of the vaginal tissue. This test is to determine if a type of bacteria, Group B streptococcus, is present. Your caregiver will explain this further.  Your caregiver may ask you:   What your birth plan is.   How you are feeling.   If you are feeling the baby move.   If you have had any abnormal symptoms, such as leaking fluid, bleeding, severe headaches, or abdominal cramping.   If you have any questions.  Other tests or screenings that may be performed during your third trimester include:   Blood tests that check for low iron levels (anemia).   Fetal testing to check the health, activity level, and growth of the fetus. Testing is done if you have certain medical conditions or if there are problems during the pregnancy.  FALSE LABOR  You may feel small, irregular contractions that eventually go away. These are called Braxton Hicks contractions, or   false labor. Contractions may last for hours, days, or even weeks before true labor sets in. If contractions come at regular intervals, intensify, or become painful, it is best to be seen by your caregiver.   SIGNS OF LABOR    Menstrual-like cramps.   Contractions that are 5 minutes apart or less.   Contractions that start on the top of the uterus and spread down to the lower abdomen and back.   A sense of increased pelvic pressure or back pain.   A watery or bloody mucus discharge that comes from the vagina.  If you have any of these signs before the 37th week of pregnancy, call your caregiver right away. You need to go to the hospital to get checked immediately.  HOME CARE INSTRUCTIONS    Avoid all  smoking, herbs, alcohol, and unprescribed drugs. These chemicals affect the formation and growth of the baby.   Follow your caregiver's instructions regarding medicine use. There are medicines that are either safe or unsafe to take during pregnancy.   Exercise only as directed by your caregiver. Experiencing uterine cramps is a good sign to stop exercising.   Continue to eat regular, healthy meals.   Wear a good support bra for breast tenderness.   Do not use hot tubs, steam rooms, or saunas.   Wear your seat belt at all times when driving.   Avoid raw meat, uncooked cheese, cat litter boxes, and soil used by cats. These carry germs that can cause birth defects in the baby.   Take your prenatal vitamins.   Try taking a stool softener (if your caregiver approves) if you develop constipation. Eat more high-fiber foods, such as fresh vegetables or fruit and whole grains. Drink plenty of fluids to keep your urine clear or pale yellow.   Take warm sitz baths to soothe any pain or discomfort caused by hemorrhoids. Use hemorrhoid cream if your caregiver approves.   If you develop varicose veins, wear support hose. Elevate your feet for 15 minutes, 3 4 times a day. Limit salt in your diet.   Avoid heavy lifting, wear low heal shoes, and practice good posture.   Rest a lot with your legs elevated if you have leg cramps or low back pain.   Visit your dentist if you have not gone during your pregnancy. Use a soft toothbrush to brush your teeth and be gentle when you floss.   A sexual relationship may be continued unless your caregiver directs you otherwise.   Do not travel far distances unless it is absolutely necessary and only with the approval of your caregiver.   Take prenatal classes to understand, practice, and ask questions about the labor and delivery.   Make a trial run to the hospital.   Pack your hospital bag.   Prepare the baby's nursery.   Continue to go to all your prenatal visits as directed  by your caregiver.  SEEK MEDICAL CARE IF:   You are unsure if you are in labor or if your water has broken.   You have dizziness.   You have mild pelvic cramps, pelvic pressure, or nagging pain in your abdominal area.   You have persistent nausea, vomiting, or diarrhea.   You have a bad smelling vaginal discharge.   You have pain with urination.  SEEK IMMEDIATE MEDICAL CARE IF:    You have a fever.   You are leaking fluid from your vagina.   You have spotting or bleeding from your vagina.     You have severe abdominal cramping or pain.   You have rapid weight loss or gain.   You have shortness of breath with chest pain.   You notice sudden or extreme swelling of your face, hands, ankles, feet, or legs.   You have not felt your baby move in over an hour.   You have severe headaches that do not go away with medicine.   You have vision changes.  Document Released: 05/15/2001 Document Revised: 01/21/2013 Document Reviewed: 07/22/2012  Exit[employer] Patient Information 2014 [employer].

## 2013-06-24 NOTE — Progress Notes (Signed)
Pulse-85 Edema-feet  Pain/pressure-pelvic

## 2013-06-25 LAB — RPR

## 2013-06-25 LAB — HIV ANTIBODY (ROUTINE TESTING W REFLEX): HIV: NONREACTIVE

## 2013-06-25 LAB — GLUCOSE TOLERANCE, 1 HOUR (50G) W/O FASTING: Glucose, 1 Hour GTT: 99 mg/dL (ref 70–140)

## 2013-07-08 ENCOUNTER — Encounter: Payer: Self-pay | Admitting: Advanced Practice Midwife

## 2013-07-08 ENCOUNTER — Ambulatory Visit (INDEPENDENT_AMBULATORY_CARE_PROVIDER_SITE_OTHER): Payer: Medicaid Other | Admitting: Advanced Practice Midwife

## 2013-07-08 VITALS — BP 123/78 | Temp 97.5°F | Wt 172.0 lb

## 2013-07-08 DIAGNOSIS — Z3490 Encounter for supervision of normal pregnancy, unspecified, unspecified trimester: Secondary | ICD-10-CM

## 2013-07-08 DIAGNOSIS — O2390 Unspecified genitourinary tract infection in pregnancy, unspecified trimester: Secondary | ICD-10-CM

## 2013-07-08 LAB — POCT URINALYSIS DIP (DEVICE)
Glucose, UA: NEGATIVE mg/dL
HGB URINE DIPSTICK: NEGATIVE
KETONES UR: NEGATIVE mg/dL
Nitrite: NEGATIVE
PH: 6.5 (ref 5.0–8.0)
PROTEIN: 30 mg/dL — AB
Specific Gravity, Urine: >=1.03 (ref 1.005–1.030)
Urobilinogen, UA: >=8 mg/dL (ref 0.0–1.0)

## 2013-07-08 NOTE — Progress Notes (Signed)
Bilateral inguinal pain with movement.  Good fetal movement, denies vaginal bleeding, LOF, regular contractions. Reviewed urine results, pt needs to drink more fluid.  Does not like water, reviewed good choices to increase PO fluids including Gatoraid/sports drinks.  Warm bath/heat/Tylenol as needed for pain.  Discussed good body mechanics with pt.  Reviewed ultrasound findings, choroid plexus cysts resolved.  EDD adjusted to 09/02/13 based on earliest ultrasound, unknown LMP because of recent delivery.

## 2013-07-08 NOTE — Progress Notes (Signed)
Pulse 79 Edema trace in feet. C/o persistent pelvic pain.

## 2013-07-13 ENCOUNTER — Encounter (HOSPITAL_COMMUNITY): Payer: Self-pay

## 2013-07-13 ENCOUNTER — Inpatient Hospital Stay (HOSPITAL_COMMUNITY)
Admission: AD | Admit: 2013-07-13 | Discharge: 2013-07-13 | Disposition: A | Discharge status: Home / Self Care | Payer: Medicaid Other | Source: Ambulatory Visit | Attending: Obstetrics and Gynecology | Admitting: Obstetrics and Gynecology

## 2013-07-13 DIAGNOSIS — O2340 Unspecified infection of urinary tract in pregnancy, unspecified trimester: Secondary | ICD-10-CM

## 2013-07-13 DIAGNOSIS — O09899 Supervision of other high risk pregnancies, unspecified trimester: Secondary | ICD-10-CM

## 2013-07-13 DIAGNOSIS — N390 Urinary tract infection, site not specified: Secondary | ICD-10-CM

## 2013-07-13 DIAGNOSIS — O9989 Other specified diseases and conditions complicating pregnancy, childbirth and the puerperium: Principal | ICD-10-CM

## 2013-07-13 DIAGNOSIS — O2390 Unspecified genitourinary tract infection in pregnancy, unspecified trimester: Secondary | ICD-10-CM

## 2013-07-13 DIAGNOSIS — B951 Streptococcus, group B, as the cause of diseases classified elsewhere: Secondary | ICD-10-CM

## 2013-07-13 DIAGNOSIS — O99891 Other specified diseases and conditions complicating pregnancy: Secondary | ICD-10-CM | POA: Insufficient documentation

## 2013-07-13 DIAGNOSIS — Z8759 Personal history of other complications of pregnancy, childbirth and the puerperium: Secondary | ICD-10-CM

## 2013-07-13 DIAGNOSIS — Z3490 Encounter for supervision of normal pregnancy, unspecified, unspecified trimester: Secondary | ICD-10-CM

## 2013-07-13 DIAGNOSIS — M25559 Pain in unspecified hip: Secondary | ICD-10-CM | POA: Insufficient documentation

## 2013-07-13 DIAGNOSIS — N949 Unspecified condition associated with female genital organs and menstrual cycle: Secondary | ICD-10-CM | POA: Insufficient documentation

## 2013-07-13 LAB — URINALYSIS, ROUTINE W REFLEX MICROSCOPIC
BILIRUBIN URINE: NEGATIVE
Glucose, UA: NEGATIVE mg/dL
Hgb urine dipstick: NEGATIVE
KETONES UR: NEGATIVE mg/dL
Leukocytes, UA: NEGATIVE
Nitrite: NEGATIVE
Protein, ur: NEGATIVE mg/dL
Specific Gravity, Urine: 1.02 (ref 1.005–1.030)
Urobilinogen, UA: 2 mg/dL — ABNORMAL HIGH (ref 0.0–1.0)
pH: 7.5 (ref 5.0–8.0)

## 2013-07-13 LAB — WET PREP, GENITAL
Clue Cells Wet Prep HPF POC: NONE SEEN
Trich, Wet Prep: NONE SEEN
Yeast Wet Prep HPF POC: NONE SEEN

## 2013-07-13 MED ORDER — CYCLOBENZAPRINE HCL 5 MG PO TABS: 5.0000 mg | ORAL_TABLET | Freq: Three times a day (TID) | ORAL | Status: DC | PRN

## 2013-07-13 NOTE — Discharge Instructions (Signed)
Abdominal Pain, Women °Abdominal (stomach, pelvic, or belly) pain can be caused by many things. It is important to tell your doctor: °· The location of the pain. °· Does it come and go or is it present all the time? °· Are there things that start the pain (eating certain foods, exercise)? °· Are there other symptoms associated with the pain (fever, nausea, vomiting, diarrhea)? °All of this is helpful to know when trying to find the cause of the pain. °CAUSES  °· Stomach: virus or bacteria infection, or ulcer. °· Intestine: appendicitis (inflamed appendix), regional ileitis (Crohn's disease), ulcerative colitis (inflamed colon), irritable bowel syndrome, diverticulitis (inflamed diverticulum of the colon), or cancer of the stomach or intestine. °· Gallbladder disease or stones in the gallbladder. °· Kidney disease, kidney stones, or infection. °· Pancreas infection or cancer. °· Fibromyalgia (pain disorder). °· Diseases of the female organs: °· Uterus: fibroid (non-cancerous) tumors or infection. °· Fallopian tubes: infection or tubal pregnancy. °· Ovary: cysts or tumors. °· Pelvic adhesions (scar tissue). °· Endometriosis (uterus lining tissue growing in the pelvis and on the pelvic organs). °· Pelvic congestion syndrome (female organs filling up with blood just before the menstrual period). °· Pain with the menstrual period. °· Pain with ovulation (producing an egg). °· Pain with an IUD (intrauterine device, birth control) in the uterus. °· Cancer of the female organs. °· Functional pain (pain not caused by a disease, may improve without treatment). °· Psychological pain. °· Depression. °DIAGNOSIS  °Your doctor will decide the seriousness of your pain by doing an examination. °· Blood tests. °· X-rays. °· Ultrasound. °· CT scan (computed tomography, special type of X-ray). °· MRI (magnetic resonance imaging). °· Cultures, for infection. °· Barium enema (dye inserted in the large intestine, to better view it with  X-rays). °· Colonoscopy (looking in intestine with a lighted tube). °· Laparoscopy (minor surgery, looking in abdomen with a lighted tube). °· Major abdominal exploratory surgery (looking in abdomen with a large incision). °TREATMENT  °The treatment will depend on the cause of the pain.  °· Many cases can be observed and treated at home. °· Over-the-counter medicines recommended by your caregiver. °· Prescription medicine. °· Antibiotics, for infection. °· Birth control pills, for painful periods or for ovulation pain. °· Hormone treatment, for endometriosis. °· Nerve blocking injections. °· Physical therapy. °· Antidepressants. °· Counseling with a psychologist or psychiatrist. °· Minor or major surgery. °HOME CARE INSTRUCTIONS  °· Do not take laxatives, unless directed by your caregiver. °· Take over-the-counter pain medicine only if ordered by your caregiver. Do not take aspirin because it can cause an upset stomach or bleeding. °· Try a clear liquid diet (broth or water) as ordered by your caregiver. Slowly move to a bland diet, as tolerated, if the pain is related to the stomach or intestine. °· Have a thermometer and take your temperature several times a day, and record it. °· Bed rest and sleep, if it helps the pain. °· Avoid sexual intercourse, if it causes pain. °· Avoid stressful situations. °· Keep your follow-up appointments and tests, as your caregiver orders. °· If the pain does not go away with medicine or surgery, you may try: °· Acupuncture. °· Relaxation exercises (yoga, meditation). °· Group therapy. °· Counseling. °SEEK MEDICAL CARE IF:  °· You notice certain foods cause stomach pain. °· Your home care treatment is not helping your pain. °· You need stronger pain medicine. °· You want your IUD removed. °· You feel faint or   lightheaded. °· You develop nausea and vomiting. °· You develop a rash. °· You are having side effects or an allergy to your medicine. °SEEK IMMEDIATE MEDICAL CARE IF:  °· Your  pain does not go away or gets worse. °· You have a fever. °· Your pain is felt only in portions of the abdomen. The right side could possibly be appendicitis. The left lower portion of the abdomen could be colitis or diverticulitis. °· You are passing blood in your stools (bright red or black tarry stools, with or without vomiting). °· You have blood in your urine. °· You develop chills, with or without a fever. °· You pass out. °MAKE SURE YOU:  °· Understand these instructions. °· Will watch your condition. °· Will get help right away if you are not doing well or get worse. °Document Released: 03/18/2007 Document Revised: 08/13/2011 Document Reviewed: 04/07/2009 °ExitCare® Patient Information ©2014 [employer]. ° °

## 2013-07-13 NOTE — MAU Provider Note (Signed)
History     CSN: [id number]  Arrival date and time: 07/13/13 1904   None     Chief Complaint  Patient presents with  . Pelvic Pain   HPI [name] is a 23 y.o. female [phone] at 6169w5d who presents to the MAU with c/o pelvic pain x 1 month. She states that the pain had a gradual onset and has since increased in intensity, which she rates as a 10/10. She describes the pain as a constant, sharp, "burning" sensation that is worse when she is standing/walking and is temporarily relieved when she is sitting. The pain is worse L>R.   She also c/o passage of clear, thick mucous plug around 130p this afternoon. She denies fever, chills, N/V/D, numbness, tingling, radiating pain, urinary incontinence, dysuria, or trauma.        +FM, No lof, No Vb, no Ctx   OB History   Grav Para Term Preterm Abortions TAB SAB Ect Mult Living   4 3 3       3       Past Medical History  Diagnosis Date  . History of postpartum eclampsia in 2009 03/03/2013    Occurred PPD#6, [facility] magnesium sulfate [facility] two [facility] (07/19/07 -07/21/07)     Past Surgical History  Procedure Laterality Date  . No past surgeries      Family History  Problem Relation Age of Onset  . Other Neg Hx   . Diabetes Brother     History  Substance Use Topics  . Smoking status: Never Smoker   . Smokeless tobacco: Never Used  . Alcohol Use: No    Allergies: No Known Allergies  Prescriptions prior to admission  Medication Sig Dispense Refill  . acetaminophen (TYLENOL) 500 MG tablet Take 500 mg by mouth every 6 (six) hours as needed for moderate pain.      . Prenatal Vit-Fe Fumarate-FA (PRENATAL MULTIVITAMIN) TABS Take 1 tablet by mouth daily.         Review of Systems  Constitutional: Negative for fever and chills.  Respiratory: Negative for cough and shortness of breath.   Cardiovascular: Positive for leg swelling. Negative for chest pain.        Mild bilateral feet swelling  Gastrointestinal: Positive for constipation. Negative for nausea, vomiting, abdominal pain, diarrhea and blood in stool.  Genitourinary: Negative for dysuria and hematuria.  Musculoskeletal: Positive for back pain. Negative for falls.  Neurological: Negative for dizziness, tingling, weakness and headaches.   Physical Exam   Blood pressure 113/68, pulse 83, temperature 98.4 F (36.9 C), temperature source Oral, resp. rate 18, height 5\' 6"  (1.676 m), weight 80.559 kg (177 lb 9.6 oz), last menstrual period 12/02/2012.  Physical Exam  VSS, NAD Gravid, NTTP, ND, NO Dilation: Fingertip Effacement (%): 20 Cervical Position: Posterior Station: -3 Exam by:: Dr. Ike Benedom  MSK: No erythema, mild TTP on illiac crest, pain with bil (L>R) hip external rotation, no pain with internal rotation.  FHT:  140s mod var, mult accel 15x15, no decel Toco: no ctx  Results for orders placed during the hospital encounter of 07/13/13 (from the past 24 hour(s))  URINALYSIS, ROUTINE W REFLEX MICROSCOPIC     Status: Abnormal   Collection Time    07/13/13  7:24 PM      Result Value Range   Color, Urine YELLOW  YELLOW   APPearance CLEAR  CLEAR   Specific Gravity, Urine  1.020  1.005 - 1.030   pH 7.5  5.0 - 8.0   Glucose, UA NEGATIVE  NEGATIVE mg/dL   Hgb urine dipstick NEGATIVE  NEGATIVE   Bilirubin Urine NEGATIVE  NEGATIVE   Ketones, ur NEGATIVE  NEGATIVE mg/dL   Protein, ur NEGATIVE  NEGATIVE mg/dL   Urobilinogen, UA 2.0 (*) 0.0 - 1.0 mg/dL   Nitrite NEGATIVE  NEGATIVE   Leukocytes, UA NEGATIVE  NEGATIVE  WET PREP, GENITAL     Status: Abnormal   Collection Time    07/13/13  9:21 PM      Result Value Range   Yeast Wet Prep HPF POC NONE SEEN  NONE SEEN   Trich, Wet Prep NONE SEEN  NONE SEEN   Clue Cells Wet Prep HPF POC NONE SEEN  NONE SEEN   WBC, Wet Prep HPF POC FEW (*) NONE SEEN     MAU Course  Procedures  MDM 1. UA >>> unremarkable, wetmoutnt unremarkable 2.  Pelvic exam   Assessment and Plan  Assessment: Jessica Hunter is a 23 y.o. female [phone] at [redacted]w[redacted]d who presents to the MAU with c/o pelvic pain x 1 month. She has bilateral hip pain that is worse when she is standing/walking - most consistent with Round ligament pain. Will treat with flexeril or APAP PRN. No evidence of infection on labs or PE.  She also c/o of passage of mucous plug earlier this afternoon. Monitor for PTL. Precautions reviewed. No acute issues at this time.   [place], [place]- Student   I spoke with and examined patient and agree with PA-S's note and plan of care.  Tawana Scale, MD Ob Fellow 07/13/2013 9:55 PM   Antoine Vandermeulen, Audie Clear 07/13/2013, 9:50 PM

## 2013-07-13 NOTE — MAU Note (Signed)
Pt G4 P3 at 32.5wks with pelvic pain x 1 month, passed mucous plug today.

## 2013-07-14 NOTE — MAU Provider Note (Signed)
Attestation of Attending Supervision of Advanced Practitioner (CNM/NP): Evaluation and management procedures were performed by the Advanced Practitioner under my supervision and collaboration.  I have reviewed the Advanced Practitioner's note and chart, and I agree with the management and plan.  Johnryan Sao 07/14/2013 7:32 AM

## 2013-07-22 ENCOUNTER — Encounter: Payer: Medicaid Other | Admitting: Obstetrics and Gynecology

## 2013-07-29 ENCOUNTER — Encounter: Payer: Medicaid Other | Admitting: Obstetrics and Gynecology

## 2013-07-31 ENCOUNTER — Encounter: Payer: Medicaid Other | Admitting: Obstetrics and Gynecology

## 2013-08-05 ENCOUNTER — Inpatient Hospital Stay (HOSPITAL_COMMUNITY)
Admission: AD | Admit: 2013-08-05 | Discharge: 2013-08-05 | Disposition: A | Discharge status: Home / Self Care | Payer: Medicaid Other | Source: Ambulatory Visit | Attending: Obstetrics & Gynecology | Admitting: Obstetrics & Gynecology

## 2013-08-05 ENCOUNTER — Encounter (HOSPITAL_COMMUNITY): Payer: Self-pay | Admitting: *Deleted

## 2013-08-05 DIAGNOSIS — B373 Candidiasis of vulva and vagina: Secondary | ICD-10-CM | POA: Insufficient documentation

## 2013-08-05 DIAGNOSIS — B3731 Acute candidiasis of vulva and vagina: Secondary | ICD-10-CM | POA: Insufficient documentation

## 2013-08-05 DIAGNOSIS — O479 False labor, unspecified: Secondary | ICD-10-CM

## 2013-08-05 DIAGNOSIS — O26859 Spotting complicating pregnancy, unspecified trimester: Secondary | ICD-10-CM

## 2013-08-05 DIAGNOSIS — O2390 Unspecified genitourinary tract infection in pregnancy, unspecified trimester: Secondary | ICD-10-CM | POA: Insufficient documentation

## 2013-08-05 DIAGNOSIS — O4700 False labor before 37 completed weeks of gestation, unspecified trimester: Secondary | ICD-10-CM

## 2013-08-05 DIAGNOSIS — N949 Unspecified condition associated with female genital organs and menstrual cycle: Secondary | ICD-10-CM | POA: Insufficient documentation

## 2013-08-05 LAB — WET PREP, GENITAL
CLUE CELLS WET PREP: NONE SEEN
Trich, Wet Prep: NONE SEEN

## 2013-08-05 LAB — URINALYSIS, ROUTINE W REFLEX MICROSCOPIC
BILIRUBIN URINE: NEGATIVE
Glucose, UA: NEGATIVE mg/dL
Hgb urine dipstick: NEGATIVE
Ketones, ur: NEGATIVE mg/dL
NITRITE: NEGATIVE
PROTEIN: NEGATIVE mg/dL
SPECIFIC GRAVITY, URINE: 1.025 (ref 1.005–1.030)
UROBILINOGEN UA: 1 mg/dL (ref 0.0–1.0)
pH: 6 (ref 5.0–8.0)

## 2013-08-05 LAB — URINE MICROSCOPIC-ADD ON

## 2013-08-05 LAB — OB RESULTS CONSOLE GC/CHLAMYDIA
Chlamydia: NEGATIVE
GC PROBE AMP, GENITAL: NEGATIVE

## 2013-08-05 LAB — OB RESULTS CONSOLE GBS: GBS: NEGATIVE

## 2013-08-05 MED ORDER — FLUCONAZOLE 150 MG PO TABS: ORAL_TABLET | ORAL | Status: DC

## 2013-08-05 NOTE — Discharge Instructions (Signed)
Reasons to return to MAU:  1.  Contractions are  5 minutes apart or less, each last 1 minute, these have been going on for 1-2 hours, and you cannot walk or talk during them 2.  You have a large gush of fluid, or a trickle of fluid that will not stop and you have to wear a pad 3.  You have bleeding that is bright red, heavier than spotting--like menstrual bleeding (spotting can be normal in early labor or after a check of your cervix) 4.  You do not feel the baby moving like he/she normally does  Yeast Vaginitis Vaginitis in a soreness, swelling and redness (inflammation) of the vagina and vulva. Monilial vaginitis is not a sexually transmitted infection. CAUSES  Yeast vaginitis is caused by yeast (candida) that is normally found in your vagina. With a yeast infection, the candida has overgrown in number to a point that upsets the chemical balance. SYMPTOMS   White, thick vaginal discharge.  Swelling, itching, redness and irritation of the vagina and possibly the lips of the vagina (vulva).  Burning or painful urination.  Painful intercourse. DIAGNOSIS  Things that may contribute to monilial vaginitis are:  Postmenopausal and virginal states.  Pregnancy.  Infections.  Being tired, sick or stressed, especially if you had monilial vaginitis in the past.  Diabetes. Good control will help lower the chance.  Birth control pills.  Tight fitting garments.  Using bubble bath, feminine sprays, douches or deodorant tampons.  Taking certain medications that kill germs (antibiotics).  Sporadic recurrence can occur if you become ill. TREATMENT  Your caregiver will give you medication.  There are several kinds of anti monilial vaginal creams and suppositories specific for monilial vaginitis. For recurrent yeast infections, use a suppository or cream in the vagina 2 times a week, or as directed.  Anti-monilial or steroid cream for the itching or irritation of the vulva may also be  used. Get your caregiver's permission.  Painting the vagina with methylene blue solution may help if the monilial cream does not work.  Eating yogurt may help prevent monilial vaginitis. HOME CARE INSTRUCTIONS   Finish all medication as prescribed.  Do not have sex until treatment is completed or after your caregiver tells you it is okay.  Take warm sitz baths.  Do not douche.  Do not use tampons, especially scented ones.  Wear cotton underwear.  Avoid tight pants and panty hose.  Tell your sexual partner that you have a yeast infection. They should go to their caregiver if they have symptoms such as mild rash or itching.  Your sexual partner should be treated as well if your infection is difficult to eliminate.  Practice safer sex. Use condoms.  Some vaginal medications cause latex condoms to fail. Vaginal medications that harm condoms are:  Cleocin cream.  Butoconazole (Femstat).  Terconazole (Terazol) vaginal suppository.  Miconazole (Monistat) (may be purchased over the counter). SEEK MEDICAL CARE IF:   You have a temperature by mouth above 102 F (38.9 C).  The infection is getting worse after 2 days of treatment.  The infection is not getting better after 3 days of treatment.  You develop blisters in or around your vagina.  You develop vaginal bleeding, and it is not your menstrual period.  You have pain when you urinate.  You develop intestinal problems.  You have pain with sexual intercourse. Document Released: 02/28/2005 Document Revised: 08/13/2011 Document Reviewed: 11/12/2008 [facility] Patient Information 2014 [place], [place].

## 2013-08-05 NOTE — MAU Note (Signed)
Noted blood when wiped this morning. Denies recent exam. Reports pain with urination. Having constant pain "in privates"

## 2013-08-05 NOTE — MAU Provider Note (Signed)
Chief Complaint:  Vaginal Bleeding and Pelvic Pain   First Provider Initiated Contact with Patient 08/05/13 1125      HPI: Jessica Hunter is a 23 y.o. G4P3003 at 2284w0d who presents to maternity admissions reporting contractions every 10 minutes today, vaginal pain/burning, and pink spotting with wiping.  [facility] r/t weather.  She reports good fetal movement, denies LOF, urinary symptoms, h/a, dizziness, n/v, or fever/chills.     Past Medical History: Past Medical History  Diagnosis Date  . History of postpartum eclampsia in 2009 03/03/2013    Occurred PPD#6, [facility], treated with magnesium sulfate [facility] for [facility] (07/19/07 -07/21/07)     Past obstetric history: OB History  Gravida Para Term Preterm AB SAB TAB Ectopic Multiple Living  4 3 3       3     # Outcome Date GA Lbr Len/2nd Weight Sex Delivery Anes PTL Lv  4 CUR           3 TRM 10/21/12 676w3d 23:25 / 00:32 3.795 kg (8 lb 5.9 oz) M SVD EPI  Y  2 TRM 07/06/09    M SVD   Y  1 TRM 07/13/07    F SVD   Y     Comments: Postpartum eclampsia on PPD#6      Past Surgical History: Past Surgical History  Procedure Laterality Date  . No past surgeries      Family History: Family History  Problem Relation Age of Onset  . Other Neg Hx   . Diabetes Brother     Social History: History  Substance Use Topics  . Smoking status: Never Smoker   . Smokeless tobacco: Never Used  . Alcohol Use: No    Allergies: Not on File  Meds:  Prescriptions prior to admission  Medication Sig Dispense Refill  . acetaminophen (TYLENOL) 500 MG tablet Take 500 mg by mouth every 6 (six) hours as needed for moderate pain.      . Prenatal Vit-Fe Fumarate-FA (PRENATAL MULTIVITAMIN) TABS Take 1 tablet by mouth daily.         ROS: Pertinent findings in history of present illness.  Physical Exam  Blood pressure 120/62, pulse 84, temperature 98.3 F (36.8 C),  temperature source Oral, resp. rate 16, last menstrual period 12/02/2012. GENERAL: Well-developed, well-nourished female in no acute distress.  HEENT: normocephalic HEART: normal rate RESP: normal effort ABDOMEN: Soft, non-tender, gravid appropriate for gestational age EXTREMITIES: Nontender, no edema NEURO: alert and oriented SPECULUM EXAM: NEFG, physiologic discharge, no blood, cervix clean Dilation: 4 Effacement (%): Thick Presentation: Vertex Exam by:: Clayton LefortL. Kirby, CNM No blood on glove following exam  FHT:  Baseline 150 , moderate variability, accelerations present, no decelerations Contractions: irregular, mild to palpation, Q10-15 minutes   Labs: Results for orders placed during the hospital encounter of 08/05/13 (from the past 24 hour(s))  URINALYSIS, ROUTINE W REFLEX MICROSCOPIC     Status: Abnormal   Collection Time    08/05/13  8:20 AM      Result Value Ref Range   Color, Urine YELLOW  YELLOW   APPearance CLEAR  CLEAR   Specific Gravity, Urine 1.025  1.005 - 1.030   pH 6.0  5.0 - 8.0   Glucose, UA NEGATIVE  NEGATIVE mg/dL   Hgb urine dipstick NEGATIVE  NEGATIVE   Bilirubin Urine NEGATIVE  NEGATIVE   Ketones, ur NEGATIVE  NEGATIVE mg/dL  Protein, ur NEGATIVE  NEGATIVE mg/dL   Urobilinogen, UA 1.0  0.0 - 1.0 mg/dL   Nitrite NEGATIVE  NEGATIVE   Leukocytes, UA MODERATE (*) NEGATIVE  URINE MICROSCOPIC-ADD ON     Status: Abnormal   Collection Time    08/05/13  8:20 AM      Result Value Ref Range   Squamous Epithelial / LPF MANY (*) RARE   WBC, UA 3-6  <3 WBC/hpf   RBC / HPF 0-2  <3 RBC/hpf   Bacteria, UA FEW (*) RARE   Urine-Other MUCOUS PRESENT    WET PREP, GENITAL     Status: Abnormal   Collection Time    08/05/13 11:25 AM      Result Value Ref Range   Yeast Wet Prep HPF POC MODERATE (*) NONE SEEN   Trich, Wet Prep NONE SEEN  NONE SEEN   Clue Cells Wet Prep HPF POC NONE SEEN  NONE SEEN   WBC, Wet Prep HPF POC MANY (*) NONE SEEN    ED Course GCC and GBS  collected as pt has missed recent appointments in clinic  Assessment: 1. Yeast vaginitis   2. Threatened preterm labor     Plan: Discharge home Labor precautions and fetal kick counts Diflucan 150 mg x2 doses sent to pt pharmacy Make appointment in [facility] for this week Return to MAU as needed      Follow-up Information   Follow up with [facility]. Schedule an appointment as soon as possible for a visit in 1 week. (Return to MAU as needed)    Specialty:  Obstetrics and Gynecology   Contact information:   [address] [place] [address] [phone]       Medication List         acetaminophen 500 MG tablet  Commonly known as:  TYLENOL  Take 500 mg by mouth every 6 (six) hours as needed for moderate pain.     fluconazole 150 MG tablet  Commonly known as:  DIFLUCAN  Take one tablet now and one tablet in 3 days.     prenatal multivitamin Tabs tablet  Take 1 tablet by mouth daily.        Sharen Counter Certified Nurse-Midwife 08/05/2013 12:16 PM

## 2013-08-06 LAB — URINE CULTURE

## 2013-08-06 LAB — GC/CHLAMYDIA PROBE AMP
CT PROBE, AMP APTIMA: NEGATIVE
GC Probe RNA: NEGATIVE

## 2013-08-07 ENCOUNTER — Encounter (HOSPITAL_COMMUNITY): Payer: Self-pay | Admitting: *Deleted

## 2013-08-07 ENCOUNTER — Inpatient Hospital Stay (HOSPITAL_COMMUNITY)
Admission: AD | Admit: 2013-08-07 | Discharge: 2013-08-07 | Disposition: A | Discharge status: Home / Self Care | Payer: Medicaid Other | Source: Ambulatory Visit | Attending: Obstetrics & Gynecology | Admitting: Obstetrics & Gynecology

## 2013-08-07 DIAGNOSIS — B951 Streptococcus, group B, as the cause of diseases classified elsewhere: Secondary | ICD-10-CM

## 2013-08-07 DIAGNOSIS — Z8759 Personal history of other complications of pregnancy, childbirth and the puerperium: Secondary | ICD-10-CM

## 2013-08-07 DIAGNOSIS — Z3490 Encounter for supervision of normal pregnancy, unspecified, unspecified trimester: Secondary | ICD-10-CM

## 2013-08-07 DIAGNOSIS — O2340 Unspecified infection of urinary tract in pregnancy, unspecified trimester: Secondary | ICD-10-CM

## 2013-08-07 DIAGNOSIS — O4700 False labor before 37 completed weeks of gestation, unspecified trimester: Secondary | ICD-10-CM | POA: Insufficient documentation

## 2013-08-07 DIAGNOSIS — O09899 Supervision of other high risk pregnancies, unspecified trimester: Secondary | ICD-10-CM

## 2013-08-07 HISTORY — DX: Gestational (pregnancy-induced) hypertension without significant proteinuria, unspecified trimester: O13.9

## 2013-08-07 HISTORY — DX: Unspecified convulsions: R56.9

## 2013-08-07 LAB — URINALYSIS, ROUTINE W REFLEX MICROSCOPIC
BILIRUBIN URINE: NEGATIVE
Glucose, UA: NEGATIVE mg/dL
HGB URINE DIPSTICK: NEGATIVE
KETONES UR: NEGATIVE mg/dL
NITRITE: NEGATIVE
PROTEIN: NEGATIVE mg/dL
SPECIFIC GRAVITY, URINE: 1.01 (ref 1.005–1.030)
UROBILINOGEN UA: 1 mg/dL (ref 0.0–1.0)
pH: 6.5 (ref 5.0–8.0)

## 2013-08-07 LAB — CULTURE, BETA STREP (GROUP B ONLY)

## 2013-08-07 LAB — URINE MICROSCOPIC-ADD ON

## 2013-08-07 NOTE — MAU Note (Signed)
Patient states she is having contractions every 10 minutes. Denies bleeding or leaking. Reports good fetal movement.

## 2013-08-07 NOTE — MAU Note (Signed)
States contractions are stronger, but not closer. No bleeding or leaking.

## 2013-08-07 NOTE — Discharge Instructions (Signed)
Braxton Hicks Contractions Pregnancy is commonly associated with contractions of the uterus throughout the pregnancy. Towards the end of pregnancy (32 to 34 weeks), these contractions [facility]) can develop more often and may become more forceful. This is not true labor because these contractions do not result in opening (dilatation) and thinning of the cervix. They are sometimes difficult to tell apart from true labor because these contractions can be forceful and people have different pain tolerances. You should not feel embarrassed if you go to the hospital with false labor. Sometimes, the only way to tell if you are in true labor is for your caregiver to follow the changes in the cervix. How to tell the difference between true and false labor:  False labor.  The contractions of false labor are usually shorter, irregular and not as hard as those of true labor.  They are often felt in the front of the lower abdomen and in the groin.  They may leave with walking around or changing positions while lying down.  They get weaker and are shorter lasting as time goes on.  These contractions are usually irregular.  They do not usually become progressively stronger, regular and closer together as with true labor.  True labor.  Contractions in true labor last 30 to 70 seconds, become very regular, usually become more intense, and increase in frequency.  They do not go away with walking.  The discomfort is usually felt in the top of the uterus and spreads to the lower abdomen and low back.  True labor can be determined by your caregiver with an exam. This will show that the cervix is dilating and getting thinner. If there are no prenatal problems or other health problems associated with the pregnancy, it is completely safe to be sent home with false labor and await the onset of true labor. HOME CARE INSTRUCTIONS   Keep up with your usual exercises and instructions.  Take medications as  directed.  Keep your regular prenatal appointment.  Eat and drink lightly if you think you are going into labor.  If BH contractions are making you uncomfortable:  Change your activity position from lying down or resting to walking/walking to resting.  Sit and rest in a tub of warm water.  Drink 2 to 3 glasses of water. Dehydration may cause B-H contractions.  Do slow and deep breathing several times an hour. SEEK IMMEDIATE MEDICAL CARE IF:   Your contractions continue to become stronger, more regular, and closer together.  You have a gushing, burst or leaking of fluid from the vagina.  An oral temperature above 102 F (38.9 C) develops.  You have passage of blood-tinged mucus.  You develop vaginal bleeding.  You develop continuous belly (abdominal) pain.  You have low back pain that you never had before.  You feel the baby's head pushing down causing pelvic pressure.  The baby is not moving as much as it used to. Document Released: 05/21/2005 Document Revised: 08/13/2011 Document Reviewed: 03/02/2013 [facility] Patient Information 2014 [place], [place]. Third Trimester of Pregnancy The third trimester is from week 29 through week 42, months 7 through 9. This trimester is when your unborn baby (fetus) is growing very fast. At the end of the ninth month, the unborn baby is about 20 inches in length. It weighs about 6 10 pounds.  HOME CARE   Avoid all smoking, herbs, and alcohol. Avoid drugs not approved by your doctor.  Only take medicine as told by your doctor. Some  medicines are safe and some are not during pregnancy.  Exercise only as told by your doctor. Stop exercising if you start having cramps.  Eat regular, healthy meals.  Wear a good support bra if your breasts are tender.  Do not use hot tubs, steam rooms, or saunas.  Wear your seat belt when driving.  Avoid raw meat, uncooked cheese, and liter boxes and soil used by cats.  Take your prenatal  vitamins.  Try taking medicine that helps you poop (stool softener) as needed, and if your doctor approves. Eat more fiber by eating fresh fruit, vegetables, and whole grains. Drink enough fluids to keep your pee (urine) clear or pale yellow.  Take warm water baths (sitz baths) to soothe pain or discomfort caused by hemorrhoids. Use hemorrhoid cream if your doctor approves.  If you have puffy, bulging veins (varicose veins), wear support hose. Raise (elevate) your feet for 15 minutes, 3 4 times a day. Limit salt in your diet.  Avoid heavy lifting, wear low heels, and sit up straight.  Rest with your legs raised if you have leg cramps or low back pain.  Visit your dentist if you have not gone during your pregnancy. Use a soft toothbrush to brush your teeth. Be gentle when you floss.  You can have sex (intercourse) unless your doctor tells you not to.  Do not travel far distances unless you must. Only do so with your doctor's approval.  Take prenatal classes.  Practice driving to the hospital.  Pack your hospital bag.  Prepare the baby's room.  Go to your doctor visits. GET HELP IF:  You are not sure if you are in labor or if your water has broken.  You are dizzy.  You have mild cramps or pressure in your lower belly (abdominal).  You have a nagging pain in your belly area.  You continue to feel sick to your stomach (nauseous), throw up (vomit), or have watery poop (diarrhea).  You have bad smelling fluid coming from your vagina.  You have pain with peeing (urination). GET HELP RIGHT AWAY IF:   You have a fever.  You are leaking fluid from your vagina.  You are spotting or bleeding from your vagina.  You have severe belly cramping or pain.  You lose or gain weight rapidly.  You have trouble catching your breath and have chest pain.  You notice sudden or extreme puffiness (swelling) of your face, hands, ankles, feet, or legs.  You have not felt the baby move in  over an hour.  You have severe headaches that do not go away with medicine.  You have vision changes. Document Released: 08/15/2009 Document Revised: 09/15/2012 Document Reviewed: 07/22/2012 [facility] Patient Information 2014 [place].

## 2013-08-11 ENCOUNTER — Ambulatory Visit (INDEPENDENT_AMBULATORY_CARE_PROVIDER_SITE_OTHER): Payer: Medicaid Other | Admitting: Family

## 2013-08-11 VITALS — BP 127/85 | Temp 98.5°F | Wt 183.1 lb

## 2013-08-11 DIAGNOSIS — O09899 Supervision of other high risk pregnancies, unspecified trimester: Secondary | ICD-10-CM

## 2013-08-11 DIAGNOSIS — Z3490 Encounter for supervision of normal pregnancy, unspecified, unspecified trimester: Secondary | ICD-10-CM

## 2013-08-11 LAB — POCT URINALYSIS DIP (DEVICE)
BILIRUBIN URINE: NEGATIVE
GLUCOSE, UA: NEGATIVE mg/dL
Hgb urine dipstick: NEGATIVE
KETONES UR: NEGATIVE mg/dL
Nitrite: NEGATIVE
Protein, ur: NEGATIVE mg/dL
SPECIFIC GRAVITY, URINE: 1.02 (ref 1.005–1.030)
Urobilinogen, UA: 1 mg/dL (ref 0.0–1.0)
pH: 7 (ref 5.0–8.0)

## 2013-08-11 NOTE — Progress Notes (Signed)
No questions or concerns.  Seen in MAU for contractions that has since resolved.  Cervix 4cm (no change since MAU)

## 2013-08-11 NOTE — Progress Notes (Signed)
p=107 

## 2013-08-20 ENCOUNTER — Ambulatory Visit (INDEPENDENT_AMBULATORY_CARE_PROVIDER_SITE_OTHER): Payer: Medicaid Other | Admitting: Obstetrics & Gynecology

## 2013-08-20 VITALS — BP 122/85 | Wt 179.1 lb

## 2013-08-20 DIAGNOSIS — O2390 Unspecified genitourinary tract infection in pregnancy, unspecified trimester: Secondary | ICD-10-CM

## 2013-08-20 DIAGNOSIS — B951 Streptococcus, group B, as the cause of diseases classified elsewhere: Secondary | ICD-10-CM

## 2013-08-20 DIAGNOSIS — O2340 Unspecified infection of urinary tract in pregnancy, unspecified trimester: Principal | ICD-10-CM

## 2013-08-20 DIAGNOSIS — N390 Urinary tract infection, site not specified: Secondary | ICD-10-CM

## 2013-08-20 DIAGNOSIS — Z3490 Encounter for supervision of normal pregnancy, unspecified, unspecified trimester: Secondary | ICD-10-CM

## 2013-08-20 DIAGNOSIS — Z3480 Encounter for supervision of other normal pregnancy, unspecified trimester: Secondary | ICD-10-CM

## 2013-08-20 LAB — POCT URINALYSIS DIP (DEVICE)
Bilirubin Urine: NEGATIVE
GLUCOSE, UA: NEGATIVE mg/dL
KETONES UR: NEGATIVE mg/dL
Nitrite: NEGATIVE
Protein, ur: NEGATIVE mg/dL
SPECIFIC GRAVITY, URINE: 1.02 (ref 1.005–1.030)
Urobilinogen, UA: 4 mg/dL — ABNORMAL HIGH (ref 0.0–1.0)
pH: 7 (ref 5.0–8.0)

## 2013-08-20 NOTE — Progress Notes (Signed)
Pulse: 89 Pt reports increased swelling in feet.

## 2013-08-20 NOTE — Progress Notes (Signed)
Pt wants a BTL.  Tubal papers to be signed today.  NO issues.  Cervix hs thinned.

## 2013-08-21 ENCOUNTER — Inpatient Hospital Stay (HOSPITAL_COMMUNITY)
Admission: AD | Admit: 2013-08-21 | Discharge: 2013-08-21 | Disposition: A | Discharge status: Home / Self Care | Payer: Medicaid Other | Source: Ambulatory Visit | Attending: Obstetrics and Gynecology | Admitting: Obstetrics and Gynecology

## 2013-08-21 ENCOUNTER — Encounter (HOSPITAL_COMMUNITY): Payer: Self-pay

## 2013-08-21 DIAGNOSIS — O479 False labor, unspecified: Secondary | ICD-10-CM | POA: Insufficient documentation

## 2013-08-21 DIAGNOSIS — O2340 Unspecified infection of urinary tract in pregnancy, unspecified trimester: Secondary | ICD-10-CM

## 2013-08-21 DIAGNOSIS — R350 Frequency of micturition: Secondary | ICD-10-CM | POA: Insufficient documentation

## 2013-08-21 DIAGNOSIS — O09899 Supervision of other high risk pregnancies, unspecified trimester: Secondary | ICD-10-CM

## 2013-08-21 DIAGNOSIS — N390 Urinary tract infection, site not specified: Secondary | ICD-10-CM

## 2013-08-21 DIAGNOSIS — Z87891 Personal history of nicotine dependence: Secondary | ICD-10-CM | POA: Insufficient documentation

## 2013-08-21 DIAGNOSIS — Z3490 Encounter for supervision of normal pregnancy, unspecified, unspecified trimester: Secondary | ICD-10-CM

## 2013-08-21 DIAGNOSIS — Z8759 Personal history of other complications of pregnancy, childbirth and the puerperium: Secondary | ICD-10-CM

## 2013-08-21 DIAGNOSIS — O2390 Unspecified genitourinary tract infection in pregnancy, unspecified trimester: Secondary | ICD-10-CM

## 2013-08-21 DIAGNOSIS — R109 Unspecified abdominal pain: Secondary | ICD-10-CM | POA: Insufficient documentation

## 2013-08-21 DIAGNOSIS — B951 Streptococcus, group B, as the cause of diseases classified elsewhere: Secondary | ICD-10-CM

## 2013-08-21 NOTE — MAU Provider Note (Signed)
History   Jessica Hunter is a 22yo [phone] reporting to the MAU for contractions since 5pm today after losing her mucus plug.  She reports +FM, blood tinged mucus, urinary frequency and abdominal pain/pelvic pressure.  The patient denies LOF.    CSN: [id number]  Arrival date and time: 08/21/13 2101   First Provider Initiated Contact with Patient 08/21/13 2147      Chief Complaint  Patient presents with  . Labor Eval   HPI  OB History   Grav Para Term Preterm Abortions TAB SAB Ect Mult Living   4 3 3       3       Past Medical History  Diagnosis Date  . History of postpartum eclampsia in 2009 03/03/2013    Occurred PPD#6, was witness in  [facility] ER, treated with magnesium sulfate and transferred to [facility] where she was admitted for two days (07/19/07 -07/21/07)   . Pregnancy induced hypertension     with first preg  . Seizures     PP with first preg    Past Surgical History  Procedure Laterality Date  . No past surgeries      Family History  Problem Relation Age of Onset  . Other Neg Hx   . Hearing loss Neg Hx   . Diabetes Brother   . Hypertension Father     History  Substance Use Topics  . Smoking status: Former Games developer  . Smokeless tobacco: Never Used     Comment: quit with last preg  . Alcohol Use: No    Allergies: No Known Allergies  Prescriptions prior to admission  Medication Sig Dispense Refill  . acetaminophen (TYLENOL) 500 MG tablet Take 500 mg by mouth every 6 (six) hours as needed for moderate pain.      . Prenatal Vit-Fe Fumarate-FA (PRENATAL MULTIVITAMIN) TABS Take 1 tablet by mouth daily at 12 noon.         Review of Systems  Constitutional: Negative.   HENT: Negative.   Eyes: Negative.   Respiratory: Negative.   Cardiovascular: Negative.   Gastrointestinal: Positive for abdominal pain.  Genitourinary: Positive for frequency. Negative for dysuria.  Musculoskeletal: Negative.   Skin: Negative.   Neurological: Negative.   Endo/Heme/Allergies:  Negative.   Psychiatric/Behavioral: Negative.    Physical Exam   Physical Exam  Constitutional: She is oriented to person, place, and time. She appears well-developed and well-nourished.  HENT:  Head: Normocephalic.  Eyes: Conjunctivae are normal.  Neck: Normal range of motion.  Cardiovascular: Normal rate and regular rhythm.   Respiratory: Effort normal and breath sounds normal.  GI: Soft.  Genitourinary:  Blood tinged mucous on glove after vaginal exam Gravid uterus  Musculoskeletal: Normal range of motion.  Neurological: She is alert and oriented to person, place, and time.  Skin: Skin is warm and dry.  Psychiatric: She has a normal mood and affect. Her behavior is normal. Thought content normal.    Dilation: 4 Effacement (%): 50 Cervical Position: Posterior Station: -3 Presentation:  (Unable to reach presenting part with cervical exam) Exam by:: Tina CNM  EFM Baseline: 150 Variability: moderate Accelerations: Present Decelerations: Absent  Category 1 Tracing, Consulted with midwife Philipp Deputy) about FHR.  She reviewed tracing and agreed that patient could be discharged.  MAU Course  Procedures  MDM NST Cervical exam  Assessment and Plan  23 yo G4P3003 at [redacted]w[redacted]d [facility] Contractions  Plan Discharge to home with labor precautions Keep next scheduled prenatal    [place],  Tina 08/21/2013, 10:20 PM   I have seen and examined this patient and I agree with the above. SHAW, KIMBERLY 1:35 AM 08/22/2013

## 2013-08-21 NOTE — Discharge Instructions (Signed)
Braxton Hicks Contractions Pregnancy is commonly associated with contractions of the uterus throughout the pregnancy. Towards the end of pregnancy (32 to 34 weeks), these contractions [facility](Braxton Willa RoughHicks) can develop more often and may become more forceful. This is not true labor because these contractions do not result in opening (dilatation) and thinning of the cervix. They are sometimes difficult to tell apart from true labor because these contractions can be forceful and people have different pain tolerances. You should not feel embarrassed if you go to the hospital with false labor. Sometimes, the only way to tell if you are in true labor is for your caregiver to follow the changes in the cervix. How to tell the difference between true and false labor:  False labor.  The contractions of false labor are usually shorter, irregular and not as hard as those of true labor.  They are often felt in the front of the lower abdomen and in the groin.  They may leave with walking around or changing positions while lying down.  They get weaker and are shorter lasting as time goes on.  These contractions are usually irregular.  They do not usually become progressively stronger, regular and closer together as with true labor.  True labor.  Contractions in true labor last 30 to 70 seconds, become very regular, usually become more intense, and increase in frequency.  They do not go away with walking.  The discomfort is usually felt in the top of the uterus and spreads to the lower abdomen and low back.  True labor can be determined by your caregiver with an exam. This will show that the cervix is dilating and getting thinner. If there are no prenatal problems or other health problems associated with the pregnancy, it is completely safe to be sent home with false labor and await the onset of true labor. HOME CARE INSTRUCTIONS   Keep up with your usual exercises and instructions.  Take medications as  directed.  Keep your regular prenatal appointment.  Eat and drink lightly if you think you are going into labor.  If BH contractions are making you uncomfortable:  Change your activity position from lying down or resting to walking/walking to resting.  Sit and rest in a tub of warm water.  Drink 2 to 3 glasses of water. Dehydration may cause B-H contractions.  Do slow and deep breathing several times an hour. SEEK IMMEDIATE MEDICAL CARE IF:   Your contractions continue to become stronger, more regular, and closer together.  You have a gushing, burst or leaking of fluid from the vagina.  An oral temperature above 102 F (38.9 C) develops.  You have passage of blood-tinged mucus.  You develop vaginal bleeding.  You develop continuous belly (abdominal) pain.  You have low back pain that you never had before.  You feel the baby's head pushing down causing pelvic pressure.  The baby is not moving as much as it used to. Document Released: 05/21/2005 Document Revised: 08/13/2011 Document Reviewed: 03/02/2013 [facility] Patient Information 2014 [place], [place]. Third Trimester of Pregnancy The third trimester is from week 29 through week 42, months 7 through 9. The third trimester is a time when the fetus is growing rapidly. At the end of the ninth month, the fetus is about 20 inches in length and weighs 6 10 pounds.  BODY CHANGES Your body goes through many changes during pregnancy. The changes vary from woman to woman.  Your weight will continue to increase. You can expect to gain  25 35 pounds (11 16 kg) by the end of the pregnancy. You may begin to get stretch marks on your hips, abdomen, and breasts. You may urinate more often because the fetus is moving lower into your pelvis and pressing on your bladder. You may develop or continue to have heartburn as a result of your pregnancy. You may develop constipation because certain hormones are causing the muscles that push waste  through your intestines to slow down. You may develop hemorrhoids or swollen, bulging veins (varicose veins). You may have pelvic pain because of the weight gain and pregnancy hormones relaxing your joints between the bones in your pelvis. Back aches may result from over exertion of the muscles supporting your posture. Your breasts will continue to grow and be tender. A yellow discharge may leak from your breasts called colostrum. Your belly button may stick out. You may feel short of breath because of your expanding uterus. You may notice the fetus "dropping," or moving lower in your abdomen. You may have a bloody mucus discharge. This usually occurs a few days to a week before labor begins. Your cervix becomes thin and soft (effaced) near your due date. WHAT TO EXPECT AT YOUR PRENATAL EXAMS  You will have prenatal exams every 2 weeks until week 36. Then, you will have weekly prenatal exams. During a routine prenatal visit: You will be weighed to make sure you and the fetus are growing normally. Your blood pressure is taken. Your abdomen will be measured to track your baby's growth. The fetal heartbeat will be listened to. Any test results from the previous visit will be discussed. You may have a cervical check near your due date to see if you have effaced. At around 36 weeks, your caregiver will check your cervix. At the same time, your caregiver will also perform a test on the secretions of the vaginal tissue. This test is to determine if a type of bacteria, Group B streptococcus, is present. Your caregiver will explain this further. Your caregiver may ask you: What your birth plan is. How you are feeling. If you are feeling the baby move. If you have had any abnormal symptoms, such as leaking fluid, bleeding, severe headaches, or abdominal cramping. If you have any questions. Other tests or screenings that may be performed during your third trimester include: Blood tests that check for  low iron levels (anemia). Fetal testing to check the health, activity level, and growth of the fetus. Testing is done if you have certain medical conditions or if there are problems during the pregnancy. FALSE LABOR You may feel small, irregular contractions that eventually go away. These are called Braxton Hicks contractions, or false labor. Contractions may last for hours, days, or even weeks before true labor sets in. If contractions come at regular intervals, intensify, or become painful, it is best to be seen by your caregiver.  SIGNS OF LABOR  Menstrual-like cramps. Contractions that are 5 minutes apart or less. Contractions that start on the top of the uterus and spread down to the lower abdomen and back. A sense of increased pelvic pressure or back pain. A watery or bloody mucus discharge that comes from the vagina. If you have any of these signs before the 37th week of pregnancy, call your caregiver right away. You need to go to the hospital to get checked immediately. HOME CARE INSTRUCTIONS  Avoid all smoking, herbs, alcohol, and unprescribed drugs. These chemicals affect the formation and growth of the baby. Follow your  caregiver's instructions regarding medicine use. There are medicines that are either safe or unsafe to take during pregnancy. Exercise only as directed by your caregiver. Experiencing uterine cramps is a good sign to stop exercising. Continue to eat regular, healthy meals. Wear a good support bra for breast tenderness. Do not use hot tubs, steam rooms, or saunas. Wear your seat belt at all times when driving. Avoid raw meat, uncooked cheese, cat litter boxes, and soil used by cats. These carry germs that can cause birth defects in the baby. Take your prenatal vitamins. Try taking a stool softener (if your caregiver approves) if you develop constipation. Eat more high-fiber foods, such as fresh vegetables or fruit and whole grains. Drink plenty of fluids to keep your  urine clear or pale yellow. Take warm sitz baths to soothe any pain or discomfort caused by hemorrhoids. Use hemorrhoid cream if your caregiver approves. If you develop varicose veins, wear support hose. Elevate your feet for 15 minutes, 3 4 times a day. Limit salt in your diet. Avoid heavy lifting, wear low heal shoes, and practice good posture. Rest a lot with your legs elevated if you have leg cramps or low back pain. Visit your dentist if you have not gone during your pregnancy. Use a soft toothbrush to brush your teeth and be gentle when you floss. A sexual relationship may be continued unless your caregiver directs you otherwise. Do not travel far distances unless it is absolutely necessary and only with the approval of your caregiver. Take prenatal classes to understand, practice, and ask questions about the labor and delivery. Make a trial run to the hospital. Pack your hospital bag. Prepare the baby's nursery. Continue to go to all your prenatal visits as directed by your caregiver. SEEK MEDICAL CARE IF: You are unsure if you are in labor or if your water has broken. You have dizziness. You have mild pelvic cramps, pelvic pressure, or nagging pain in your abdominal area. You have persistent nausea, vomiting, or diarrhea. You have a bad smelling vaginal discharge. You have pain with urination. SEEK IMMEDIATE MEDICAL CARE IF:  You have a fever. You are leaking fluid from your vagina. You have spotting or bleeding from your vagina. You have severe abdominal cramping or pain. You have rapid weight loss or gain. You have shortness of breath with chest pain. You notice sudden or extreme swelling of your face, hands, ankles, feet, or legs. You have not felt your baby move in over an hour. You have severe headaches that do not go away with medicine. You have vision changes. Document Released: 05/15/2001 Document Revised: 01/21/2013 Document Reviewed: 07/22/2012 [facility] Patient  Information 2014 [place], [place]. Fetal Movement Counts Patient Name: __________________________________________________ Patient Due Date: ____________________ Performing a fetal movement count is highly recommended in high-risk pregnancies, but it is good for every pregnant woman to do. Your caregiver may ask you to start counting fetal movements at 28 weeks of the pregnancy. Fetal movements often increase: After eating a full meal. After physical activity. After eating or drinking something sweet or cold. At rest. Pay attention to when you feel the baby is most active. This will help you notice a pattern of your baby's sleep and wake cycles and what factors contribute to an increase in fetal movement. It is important to perform a fetal movement count at the same time each day when your baby is normally most active.  HOW TO COUNT FETAL MOVEMENTS Find a quiet and comfortable area to sit or  lie down on your left side. Lying on your left side provides the best blood and oxygen circulation to your baby. Write down the day and time on a sheet of paper or in a journal. Start counting kicks, flutters, swishes, rolls, or jabs in a 2 hour period. You should feel at least 10 movements within 2 hours. If you do not feel 10 movements in 2 hours, wait 2 3 hours and count again. Look for a change in the pattern or not enough counts in 2 hours. SEEK MEDICAL CARE IF: You feel less than 10 counts in 2 hours, tried twice. There is no movement in over an hour. The pattern is changing or taking longer each day to reach 10 counts in 2 hours. You feel the baby is not moving as he or she usually does. Date: ____________ Movements: ____________ Start time: ____________ Doreatha Martin time: ____________  Date: ____________ Movements: ____________ Start time: ____________ Doreatha Martin time: ____________ Date: ____________ Movements: ____________ Start time: ____________ Doreatha Martin time: ____________ Date: ____________ Movements:  ____________ Start time: ____________ Doreatha Martin time: ____________ Date: ____________ Movements: ____________ Start time: ____________ Doreatha Martin time: ____________ Date: ____________ Movements: ____________ Start time: ____________ Doreatha Martin time: ____________ Date: ____________ Movements: ____________ Start time: ____________ Doreatha Martin time: ____________ Date: ____________ Movements: ____________ Start time: ____________ Doreatha Martin time: ____________  Date: ____________ Movements: ____________ Start time: ____________ Doreatha Martin time: ____________ Date: ____________ Movements: ____________ Start time: ____________ Doreatha Martin time: ____________ Date: ____________ Movements: ____________ Start time: ____________ Doreatha Martin time: ____________ Date: ____________ Movements: ____________ Start time: ____________ Doreatha Martin time: ____________ Date: ____________ Movements: ____________ Start time: ____________ Doreatha Martin time: ____________ Date: ____________ Movements: ____________ Start time: ____________ Doreatha Martin time: ____________ Date: ____________ Movements: ____________ Start time: ____________ Doreatha Martin time: ____________  Date: ____________ Movements: ____________ Start time: ____________ Doreatha Martin time: ____________ Date: ____________ Movements: ____________ Start time: ____________ Doreatha Martin time: ____________ Date: ____________ Movements: ____________ Start time: ____________ Doreatha Martin time: ____________ Date: ____________ Movements: ____________ Start time: ____________ Doreatha Martin time: ____________ Date: ____________ Movements: ____________ Start time: ____________ Doreatha Martin time: ____________ Date: ____________ Movements: ____________ Start time: ____________ Doreatha Martin time: ____________ Date: ____________ Movements: ____________ Start time: ____________ Doreatha Martin time: ____________  Date: ____________ Movements: ____________ Start time: ____________ Doreatha Martin time: ____________ Date: ____________ Movements: ____________ Start time: ____________ Doreatha Martin  time: ____________ Date: ____________ Movements: ____________ Start time: ____________ Doreatha Martin time: ____________ Date: ____________ Movements: ____________ Start time: ____________ Doreatha Martin time: ____________ Date: ____________ Movements: ____________ Start time: ____________ Doreatha Martin time: ____________ Date: ____________ Movements: ____________ Start time: ____________ Doreatha Martin time: ____________ Date: ____________ Movements: ____________ Start time: ____________ Doreatha Martin time: ____________  Date: ____________ Movements: ____________ Start time: ____________ Doreatha Martin time: ____________ Date: ____________ Movements: ____________ Start time: ____________ Doreatha Martin time: ____________ Date: ____________ Movements: ____________ Start time: ____________ Doreatha Martin time: ____________ Date: ____________ Movements: ____________ Start time: ____________ Doreatha Martin time: ____________ Date: ____________ Movements: ____________ Start time: ____________ Doreatha Martin time: ____________ Date: ____________ Movements: ____________ Start time: ____________ Doreatha Martin time: ____________ Date: ____________ Movements: ____________ Start time: ____________ Doreatha Martin time: ____________  Date: ____________ Movements: ____________ Start time: ____________ Doreatha Martin time: ____________ Date: ____________ Movements: ____________ Start time: ____________ Doreatha Martin time: ____________ Date: ____________ Movements: ____________ Start time: ____________ Doreatha Martin time: ____________ Date: ____________ Movements: ____________ Start time: ____________ Doreatha Martin time: ____________ Date: ____________ Movements: ____________ Start time: ____________ Doreatha Martin time: ____________ Date: ____________ Movements: ____________ Start time: ____________ Doreatha Martin time: ____________ Date: ____________ Movements: ____________ Start time: ____________ Doreatha Martin time: ____________  Date: ____________ Movements: ____________ Start time: ____________ Doreatha Martin time: ____________ Date: ____________  Movements: ____________ Start time: ____________ Doreatha Martin  time: ____________ Date: ____________ Movements: ____________ Start time: ____________ Doreatha Martin time: ____________ Date: ____________ Movements: ____________ Start time: ____________ Doreatha Martin time: ____________ Date: ____________ Movements: ____________ Start time: ____________ Doreatha Martin time: ____________ Date: ____________ Movements: ____________ Start time: ____________ Doreatha Martin time: ____________ Date: ____________ Movements: ____________ Start time: ____________ Doreatha Martin time: ____________  Date: ____________ Movements: ____________ Start time: ____________ Doreatha Martin time: ____________ Date: ____________ Movements: ____________ Start time: ____________ Doreatha Martin time: ____________ Date: ____________ Movements: ____________ Start time: ____________ Doreatha Martin time: ____________ Date: ____________ Movements: ____________ Start time: ____________ Doreatha Martin time: ____________ Date: ____________ Movements: ____________ Start time: ____________ Doreatha Martin time: ____________ Date: ____________ Movements: ____________ Start time: ____________ Doreatha Martin time: ____________ Document Released: 06/20/2006 Document Revised: 05/07/2012 Document Reviewed: 03/17/2012 Exit[employer] Patient Information 2014 [place].

## 2013-08-21 NOTE — MAU Note (Signed)
Having contractions since 5 pm after mucus plug came out.  Mucus was blood tinge.  Denies leaking.  Reports baby active.

## 2013-08-23 NOTE — MAU Provider Note (Signed)
Attestation of Attending Supervision of Advanced Practitioner: Evaluation and management procedures were performed by the PA/NP/CNM/OB Fellow under my supervision/collaboration. Chart reviewed and agree with management and plan.  Amery Vandenbos V 08/23/2013 11:16 PM

## 2013-08-24 ENCOUNTER — Encounter: Payer: Self-pay | Admitting: *Deleted

## 2013-08-27 ENCOUNTER — Inpatient Hospital Stay (HOSPITAL_COMMUNITY): Payer: Medicaid Other | Admitting: Anesthesiology

## 2013-08-27 ENCOUNTER — Encounter (HOSPITAL_COMMUNITY): Payer: Self-pay | Admitting: *Deleted

## 2013-08-27 ENCOUNTER — Inpatient Hospital Stay (HOSPITAL_COMMUNITY)
Admission: AD | Admit: 2013-08-27 | Discharge: 2013-08-29 | DRG: 775 | Disposition: A | Discharge status: Home / Self Care | Payer: Medicaid Other | Source: Ambulatory Visit | Attending: Obstetrics & Gynecology | Admitting: Obstetrics & Gynecology

## 2013-08-27 ENCOUNTER — Ambulatory Visit (INDEPENDENT_AMBULATORY_CARE_PROVIDER_SITE_OTHER): Payer: Medicaid Other | Admitting: Obstetrics & Gynecology

## 2013-08-27 ENCOUNTER — Encounter (HOSPITAL_COMMUNITY): Payer: Medicaid Other | Admitting: Anesthesiology

## 2013-08-27 VITALS — BP 132/86 | Temp 97.7°F | Wt 181.0 lb

## 2013-08-27 DIAGNOSIS — B951 Streptococcus, group B, as the cause of diseases classified elsewhere: Secondary | ICD-10-CM

## 2013-08-27 DIAGNOSIS — O09899 Supervision of other high risk pregnancies, unspecified trimester: Secondary | ICD-10-CM

## 2013-08-27 DIAGNOSIS — N390 Urinary tract infection, site not specified: Secondary | ICD-10-CM

## 2013-08-27 DIAGNOSIS — Z87891 Personal history of nicotine dependence: Secondary | ICD-10-CM

## 2013-08-27 DIAGNOSIS — O2390 Unspecified genitourinary tract infection in pregnancy, unspecified trimester: Secondary | ICD-10-CM

## 2013-08-27 DIAGNOSIS — O99892 Other specified diseases and conditions complicating childbirth: Principal | ICD-10-CM | POA: Diagnosis present

## 2013-08-27 DIAGNOSIS — Z3490 Encounter for supervision of normal pregnancy, unspecified, unspecified trimester: Secondary | ICD-10-CM

## 2013-08-27 DIAGNOSIS — O9989 Other specified diseases and conditions complicating pregnancy, childbirth and the puerperium: Secondary | ICD-10-CM

## 2013-08-27 DIAGNOSIS — IMO0001 Reserved for inherently not codable concepts without codable children: Secondary | ICD-10-CM

## 2013-08-27 DIAGNOSIS — O2340 Unspecified infection of urinary tract in pregnancy, unspecified trimester: Principal | ICD-10-CM

## 2013-08-27 DIAGNOSIS — Z8759 Personal history of other complications of pregnancy, childbirth and the puerperium: Secondary | ICD-10-CM

## 2013-08-27 DIAGNOSIS — Z22330 Carrier of Group B streptococcus: Secondary | ICD-10-CM

## 2013-08-27 DIAGNOSIS — Z3480 Encounter for supervision of other normal pregnancy, unspecified trimester: Secondary | ICD-10-CM

## 2013-08-27 LAB — POCT URINALYSIS DIP (DEVICE)
BILIRUBIN URINE: NEGATIVE
Glucose, UA: NEGATIVE mg/dL
HGB URINE DIPSTICK: NEGATIVE
NITRITE: NEGATIVE
Protein, ur: 30 mg/dL — AB
SPECIFIC GRAVITY, URINE: 1.025 (ref 1.005–1.030)
UROBILINOGEN UA: 4 mg/dL — AB (ref 0.0–1.0)
pH: 6.5 (ref 5.0–8.0)

## 2013-08-27 LAB — CBC
HEMATOCRIT: 37.3 % (ref 36.0–46.0)
Hemoglobin: 12 g/dL (ref 12.0–15.0)
MCH: 24.3 pg — AB (ref 26.0–34.0)
MCHC: 32.2 g/dL (ref 30.0–36.0)
MCV: 75.7 fL — AB (ref 78.0–100.0)
Platelets: 198 10*3/uL (ref 150–400)
RBC: 4.93 MIL/uL (ref 3.87–5.11)
RDW: 16.9 % — AB (ref 11.5–15.5)
WBC: 11 10*3/uL — AB (ref 4.0–10.5)

## 2013-08-27 MED ORDER — PHENYLEPHRINE 40 MCG/ML (10ML) SYRINGE FOR IV PUSH (FOR BLOOD PRESSURE SUPPORT)
80.0000 ug | PREFILLED_SYRINGE | INTRAVENOUS | Status: DC | PRN
Start: 2013-08-27 — End: 2013-08-28
  Filled 2013-08-27: qty 10
  Filled 2013-08-27: qty 2

## 2013-08-27 MED ORDER — ONDANSETRON HCL 4 MG/2ML IJ SOLN: 4.0000 mg | Freq: Four times a day (QID) | INTRAMUSCULAR | Status: DC | PRN

## 2013-08-27 MED ORDER — PHENYLEPHRINE 40 MCG/ML (10ML) SYRINGE FOR IV PUSH (FOR BLOOD PRESSURE SUPPORT)
80.0000 ug | PREFILLED_SYRINGE | INTRAVENOUS | Status: DC | PRN
  Filled 2013-08-27: qty 2

## 2013-08-27 MED ORDER — OXYCODONE-ACETAMINOPHEN 5-325 MG PO TABS
1.0000 | ORAL_TABLET | ORAL | Status: DC | PRN
Start: 2013-08-27 — End: 2013-08-28

## 2013-08-27 MED ORDER — FENTANYL 2.5 MCG/ML BUPIVACAINE 1/10 % EPIDURAL INFUSION (WH - ANES)
14.0000 mL/h | INTRAMUSCULAR | Status: DC | PRN
  Administered 2013-08-27: 14 mL/h via EPIDURAL
  Filled 2013-08-27: qty 125

## 2013-08-27 MED ORDER — OXYTOCIN BOLUS FROM INFUSION
500.0000 mL | INTRAVENOUS | Status: DC
  Administered 2013-08-28: 500 mL via INTRAVENOUS

## 2013-08-27 MED ORDER — EPHEDRINE 5 MG/ML INJ
10.0000 mg | INTRAVENOUS | Status: DC | PRN
  Filled 2013-08-27: qty 2

## 2013-08-27 MED ORDER — LACTATED RINGERS IV SOLN: 500.0000 mL | INTRAVENOUS | Status: DC | PRN

## 2013-08-27 MED ORDER — LACTATED RINGERS IV SOLN
500.0000 mL | Freq: Once | INTRAVENOUS | Status: AC
Start: 2013-08-27 — End: 2013-08-27
  Administered 2013-08-27: 500 mL via INTRAVENOUS

## 2013-08-27 MED ORDER — SODIUM CHLORIDE 0.9 % IV SOLN
2.0000 g | Freq: Once | INTRAVENOUS | Status: AC
  Administered 2013-08-27: 2 g via INTRAVENOUS
  Filled 2013-08-27: qty 2000

## 2013-08-27 MED ORDER — ACETAMINOPHEN 325 MG PO TABS: 650.0000 mg | ORAL_TABLET | ORAL | Status: DC | PRN

## 2013-08-27 MED ORDER — FENTANYL CITRATE 0.05 MG/ML IJ SOLN: 50.0000 ug | INTRAMUSCULAR | Status: DC | PRN

## 2013-08-27 MED ORDER — DIPHENHYDRAMINE HCL 50 MG/ML IJ SOLN: 12.5000 mg | INTRAMUSCULAR | Status: DC | PRN

## 2013-08-27 MED ORDER — LIDOCAINE HCL (PF) 1 % IJ SOLN
INTRAMUSCULAR | Status: DC | PRN
  Administered 2013-08-27 (×4): 4 mL

## 2013-08-27 MED ORDER — LACTATED RINGERS IV SOLN
INTRAVENOUS | Status: DC
  Administered 2013-08-27 (×2): via INTRAVENOUS

## 2013-08-27 MED ORDER — OXYTOCIN 40 UNITS IN LACTATED RINGERS INFUSION - SIMPLE MED
62.5000 mL/h | INTRAVENOUS | Status: DC
  Filled 2013-08-27: qty 1000

## 2013-08-27 MED ORDER — LIDOCAINE HCL (PF) 1 % IJ SOLN
30.0000 mL | INTRAMUSCULAR | Status: DC | PRN
  Filled 2013-08-27: qty 30

## 2013-08-27 MED ORDER — IBUPROFEN 600 MG PO TABS: 600.0000 mg | ORAL_TABLET | Freq: Four times a day (QID) | ORAL | Status: DC | PRN

## 2013-08-27 MED ORDER — FLEET ENEMA 7-19 GM/118ML RE ENEM: 1.0000 | ENEMA | RECTAL | Status: DC | PRN

## 2013-08-27 MED ORDER — CITRIC ACID-SODIUM CITRATE 334-500 MG/5ML PO SOLN: 30.0000 mL | ORAL | Status: DC | PRN

## 2013-08-27 MED ORDER — EPHEDRINE 5 MG/ML INJ
10.0000 mg | INTRAVENOUS | Status: DC | PRN
  Filled 2013-08-27: qty 4
  Filled 2013-08-27: qty 2

## 2013-08-27 NOTE — Patient Instructions (Signed)
Return to clinic for any obstetric concerns or go to MAU for evaluation  

## 2013-08-27 NOTE — Anesthesia Preprocedure Evaluation (Signed)
Anesthesia Evaluation  Patient identified by MRN, date of birth, ID band Patient awake    Reviewed: Allergy & Precautions, H&P , NPO status , Patient's Chart, lab work & pertinent test results, reviewed documented beta blocker date and time   History of Anesthesia Complications Negative for: history of anesthetic complications  Airway Mallampati: I TM Distance: >3 FB Neck ROM: full    Dental  (+) Teeth Intact   Pulmonary neg pulmonary ROS, former smoker,  breath sounds clear to auscultation        Cardiovascular negative cardio ROS  Rhythm:regular Rate:Normal     Neuro/Psych negative psych ROS   GI/Hepatic negative GI ROS, Neg liver ROS,   Endo/Other  negative endocrine ROS  Renal/GU negative Renal ROS  negative genitourinary   Musculoskeletal   Abdominal   Peds  Hematology negative hematology ROS (+)   Anesthesia Other Findings   Reproductive/Obstetrics (+) Pregnancy                           Anesthesia Physical Anesthesia Plan  ASA: II  Anesthesia Plan: Epidural   Post-op Pain Management:    Induction:   Airway Management Planned:   Additional Equipment:   Intra-op Plan:   Post-operative Plan:   Informed Consent: I have reviewed the patients History and Physical, chart, labs and discussed the procedure including the risks, benefits and alternatives for the proposed anesthesia with the patient or authorized representative who has indicated his/her understanding and acceptance.     Plan Discussed with:   Anesthesia Plan Comments:         Anesthesia Quick Evaluation

## 2013-08-27 NOTE — Anesthesia Procedure Notes (Signed)
Epidural Patient location during procedure: OB Start time: 08/27/2013 9:51 PM  Staffing Performed by: anesthesiologist   Preanesthetic Checklist Completed: patient identified, site marked, surgical consent, pre-op evaluation, timeout performed, IV checked, risks and benefits discussed and monitors and equipment checked  Epidural Patient position: sitting Prep: site prepped and draped and DuraPrep Patient monitoring: continuous pulse ox and blood pressure Approach: midline Injection technique: LOR air  Needle:  Needle type: Tuohy  Needle gauge: 17 G Needle length: 9 cm and 9 Needle insertion depth: 5 cm cm Catheter type: closed end flexible Catheter size: 19 Gauge Catheter at skin depth: 10 cm Test dose: negative  Assessment Events: blood not aspirated, injection not painful, no injection resistance, negative IV test and no paresthesia  Additional Notes Discussed risk of headache, infection, bleeding, nerve injury and failed or incomplete block.  Patient voices understanding and wishes to proceed.  Epidural placed easily on first attempt.  No paresthesia.  Patient tolerated procedure well with no apparent complications.  Jasmine DecemberA. Aron Needles, MDReason for block:procedure for pain

## 2013-08-27 NOTE — H&P (Signed)
Jessica Hunter is a 23 y.o. female [phone] with IUP at 4139w1d presenting for contractions. Pt states she has been having irregular, every 4 minutes contractions, associated with none vaginal bleeding.  Membranes are intact, with active fetal movement.   [facility]  Prenatal History/Complications:  Pp eclampsia Short interval b/t pregnancies   Past Medical History: Past Medical History  Diagnosis Date  . History of postpartum eclampsia in 2009 03/03/2013    Occurred PPD#6, [facility], [facility] magnesium sulfate [facility] (07/19/07 -07/21/07)   . Pregnancy induced hypertension     with first preg  . Seizures     PP with first preg    Past Surgical History: Past Surgical History  Procedure Laterality Date  . No past surgeries      Obstetrical History: OB History   Grav Para Term Preterm Abortions TAB SAB Ect Mult Living   4 3 3       3       Social History: History   Social History  . Marital Status: Single    Spouse Name: N/A    Number of Children: N/A  . Years of Education: N/A   Social History Main Topics  . Smoking status: Former Games developermoker  . Smokeless tobacco: Never Used     Comment: quit with last preg  . Alcohol Use: No  . Drug Use: No  . Sexual Activity: Yes    Birth Control/ Protection: None   Other Topics Concern  . None   Social History Narrative  . None    Family History: Family History  Problem Relation Age of Onset  . Other Neg Hx   . Hearing loss Neg Hx   . Diabetes Brother   . Hypertension Father     Allergies: No Known Allergies  Prescriptions prior to admission  Medication Sig Dispense Refill  . acetaminophen (TYLENOL) 500 MG tablet Take 500 mg by mouth every 6 (six) hours as needed for moderate pain.      . Prenatal Vit-Fe Fumarate-FA (PRENATAL MULTIVITAMIN) TABS Take 1 tablet by mouth daily at 12 noon.          Prenatal Transfer Tool  Maternal  Diabetes: No Genetic Screening: Normal Maternal Ultrasounds/Referrals: Normal Fetal Ultrasounds or other Referrals:  Other:  Maternal Substance Abuse:  No Significant Maternal Medications:  None Significant Maternal Lab Results: Lab values include: Group B Strep positive     Review of Systems   Constitutional: Negative for fever, chills, weight loss, malaise/fatigue and diaphoresis.  HENT: Negative for hearing loss, ear pain, nosebleeds, congestion, sore throat, neck pain, tinnitus and ear discharge.   Eyes: Negative for blurred vision, double vision, photophobia, pain, discharge and redness.  Respiratory: Negative for cough, hemoptysis, sputum production, shortness of breath, wheezing and stridor.   Cardiovascular: Negative for chest pain, palpitations, orthopnea,  leg swelling  Gastrointestinal: Positive for abdominal pain. Negative for heartburn, nausea, vomiting, diarrhea, constipation, blood in stool Genitourinary: Negative for dysuria, urgency, frequency, hematuria and flank pain.  Musculoskeletal: Negative for myalgias, back pain, joint pain and falls.  Skin: Negative for itching and rash.  Neurological: Negative for dizziness, tingling, tremors, sensory change, speech change, focal weakness, seizures, loss of consciousness, weakness and headaches.  Endo/Heme/Allergies: Negative for environmental allergies and polydipsia. Does not bruise/bleed easily.  Psychiatric/Behavioral: Negative for depression, suicidal ideas, hallucinations, memory loss and substance abuse. The patient is not nervous/anxious  and does not have insomnia.       Blood pressure 129/78, pulse 93, temperature 98.2 F (36.8 C), temperature source Oral, resp. rate 20, last menstrual period 12/02/2012. General appearance: alert, cooperative and mild distress Lungs: clear to auscultation bilaterally Heart: regular rate and rhythm Abdomen: soft, non-tender; bowel sounds normal Extremities: Homans sign is  negative, no sign of DVT DTR's 2+ Presentation: cephalic Fetal monitoringBaseline: 140 bpm, Variability: Good {> 6 bpm), Accelerations: Reactive and Decelerations: Absent Uterine activity  q 3 minutes Dilation: 6 Effacement (%): 70 Station: -1 Exam by:: Caprice Renshaw, RN   Prenatal labs: ABO, Rh: AB/POS/-- (09/30 1030) Antibody: NEG (09/30 1030) Rubella:  immuine RPR: NON REAC (01/21 1012)  HBsAg: NEGATIVE (09/30 1030)  HIV: NON REACTIVE (01/21 1012)  GBS: Negative (03/04 0000)  1 hr Glucola 99 Genetic screening  Normal NIPS Anatomy US: EIF   Results for orders placed in visit on 08/27/13 (from the past 24 hour(s))  POCT URINALYSIS DIP (DEVICE)   Collection Time    08/27/13  2:27 PM      Result Value Ref Range   Glucose, UA NEGATIVE  NEGATIVE mg/dL   Bilirubin Urine NEGATIVE  NEGATIVE   Ketones, ur TRACE (*) NEGATIVE mg/dL   Specific Gravity, Urine 1.025  1.005 - 1.030   Hgb urine dipstick NEGATIVE  NEGATIVE   pH 6.5  5.0 - 8.0   Protein, ur 30 (*) NEGATIVE mg/dL   Urobilinogen, UA 4.0 (*) 0.0 - 1.0 mg/dL   Nitrite NEGATIVE  NEGATIVE   Leukocytes, UA MODERATE (*) NEGATIVE    Assessment: Jessica Hunter is a 23 y.o. [phone] with an IUP at [redacted]w[redacted]d presenting for active labor  Plan: Expectant management GBS prophylaxis   CRESENZO-DISHMAN,Edwina Grossberg 08/27/2013, 8:18 PM

## 2013-08-27 NOTE — MAU Note (Signed)
PT ARRIV[facility] VIA EMS-  [facility]   HAS BEEN 4 CM  X2WEEKS.   .   TODAY AT 4 PM - UC BAD,     DENIES HSV AND MRSA.

## 2013-08-27 NOTE — Progress Notes (Signed)
P= 83 C/o of pelvic/vaginal pressure.

## 2013-08-27 NOTE — Progress Notes (Signed)
No other complaints or concerns.  Fetal movement and labor precautions reviewed. Postdates testing to start next week.

## 2013-08-28 ENCOUNTER — Encounter (HOSPITAL_COMMUNITY): Payer: Self-pay | Admitting: *Deleted

## 2013-08-28 LAB — RPR: RPR Ser Ql: NONREACTIVE

## 2013-08-28 MED ORDER — METHYLERGONOVINE MALEATE 0.2 MG/ML IJ SOLN: 0.2000 mg | INTRAMUSCULAR | Status: DC | PRN

## 2013-08-28 MED ORDER — OXYCODONE-ACETAMINOPHEN 5-325 MG PO TABS
1.0000 | ORAL_TABLET | ORAL | Status: DC | PRN
  Administered 2013-08-29: 1 via ORAL
  Administered 2013-08-29: 2 via ORAL
  Filled 2013-08-28: qty 2
  Filled 2013-08-28: qty 1

## 2013-08-28 MED ORDER — DIPHENHYDRAMINE HCL 25 MG PO CAPS: 25.0000 mg | ORAL_CAPSULE | Freq: Four times a day (QID) | ORAL | Status: DC | PRN

## 2013-08-28 MED ORDER — IBUPROFEN 600 MG PO TABS
600.0000 mg | ORAL_TABLET | Freq: Four times a day (QID) | ORAL | Status: DC
  Administered 2013-08-28 – 2013-08-29 (×5): 600 mg via ORAL
  Filled 2013-08-28 (×5): qty 1

## 2013-08-28 MED ORDER — ONDANSETRON HCL 4 MG PO TABS: 4.0000 mg | ORAL_TABLET | ORAL | Status: DC | PRN

## 2013-08-28 MED ORDER — LORATADINE 10 MG PO TABS
10.0000 mg | ORAL_TABLET | Freq: Every day | ORAL | Status: DC
  Administered 2013-08-28 – 2013-08-29 (×2): 10 mg via ORAL
  Filled 2013-08-28 (×2): qty 1

## 2013-08-28 MED ORDER — BISACODYL 10 MG RE SUPP: 10.0000 mg | Freq: Every day | RECTAL | Status: DC | PRN

## 2013-08-28 MED ORDER — DIBUCAINE 1 % RE OINT: 1.0000 "application " | TOPICAL_OINTMENT | RECTAL | Status: DC | PRN

## 2013-08-28 MED ORDER — FERROUS SULFATE 325 (65 FE) MG PO TABS
325.0000 mg | ORAL_TABLET | Freq: Two times a day (BID) | ORAL | Status: DC
  Administered 2013-08-28 – 2013-08-29 (×3): 325 mg via ORAL
  Filled 2013-08-28 (×3): qty 1

## 2013-08-28 MED ORDER — WITCH HAZEL-GLYCERIN EX PADS: 1.0000 "application " | MEDICATED_PAD | CUTANEOUS | Status: DC | PRN

## 2013-08-28 MED ORDER — ONDANSETRON HCL 4 MG/2ML IJ SOLN: 4.0000 mg | INTRAMUSCULAR | Status: DC | PRN

## 2013-08-28 MED ORDER — LANOLIN HYDROUS EX OINT: TOPICAL_OINTMENT | CUTANEOUS | Status: DC | PRN

## 2013-08-28 MED ORDER — SIMETHICONE 80 MG PO CHEW: 80.0000 mg | CHEWABLE_TABLET | ORAL | Status: DC | PRN

## 2013-08-28 MED ORDER — PRENATAL MULTIVITAMIN CH
1.0000 | ORAL_TABLET | Freq: Every day | ORAL | Status: DC
  Administered 2013-08-28 – 2013-08-29 (×2): 1 via ORAL
  Filled 2013-08-28 (×2): qty 1

## 2013-08-28 MED ORDER — TETANUS-DIPHTH-ACELL PERTUSSIS 5-2.5-18.5 LF-MCG/0.5 IM SUSP: 0.5000 mL | Freq: Once | INTRAMUSCULAR | Status: DC

## 2013-08-28 MED ORDER — BENZOCAINE-MENTHOL 20-0.5 % EX AERO: 1.0000 "application " | INHALATION_SPRAY | CUTANEOUS | Status: DC | PRN

## 2013-08-28 MED ORDER — INFLUENZA VAC SPLIT QUAD 0.5 ML IM SUSP
0.5000 mL | INTRAMUSCULAR | Status: DC
  Filled 2013-08-28: qty 0.5

## 2013-08-28 MED ORDER — SENNOSIDES-DOCUSATE SODIUM 8.6-50 MG PO TABS
2.0000 | ORAL_TABLET | ORAL | Status: DC
  Administered 2013-08-28: 2 via ORAL
  Filled 2013-08-28: qty 2

## 2013-08-28 MED ORDER — MEASLES, MUMPS & RUBELLA VAC ~~LOC~~ INJ
0.5000 mL | INJECTION | Freq: Once | SUBCUTANEOUS | Status: DC
  Filled 2013-08-28: qty 0.5

## 2013-08-28 MED ORDER — OXYTOCIN 40 UNITS IN LACTATED RINGERS INFUSION - SIMPLE MED: 62.5000 mL/h | INTRAVENOUS | Status: DC | PRN

## 2013-08-28 MED ORDER — METHYLERGONOVINE MALEATE 0.2 MG PO TABS: 0.2000 mg | ORAL_TABLET | ORAL | Status: DC | PRN

## 2013-08-28 MED ORDER — FLEET ENEMA 7-19 GM/118ML RE ENEM: 1.0000 | ENEMA | Freq: Every day | RECTAL | Status: DC | PRN

## 2013-08-28 MED ORDER — ZOLPIDEM TARTRATE 5 MG PO TABS: 5.0000 mg | ORAL_TABLET | Freq: Every evening | ORAL | Status: DC | PRN

## 2013-08-28 NOTE — Progress Notes (Signed)
UR chart review completed.  

## 2013-08-28 NOTE — Lactation Note (Addendum)
This note was copied from the chart of Jessica Diandra Katrinka BlazingSmith. Lactation Consultation Note  Patient Name: Jessica Davis GourdDyisha Harbour OZHYQ'MToday's Date: 08/28/2013 Reason for consult: Initial assessment Per mom baby hasn't fed since this am around 9am .  LC changed a large wet and stool diaper and assisted mom with positioning  And depth the breast in cross cradle right side to cradle , baby fed for 15 mins with multiply swallows  Increased with breast compressions and per mom comfortable. Nipple appeared normal when baby released.   Offer 2nd breast and baby fell asleep. Baby skin to skin with mom. LC reviewed basics , breast massage  , hand express , and latch. Steady flow of colostrum noted and mom able to return demo of hand expressing . Reviewed sore nipple and engorgement prevention and tx . Per mom active with WIC - [place] and will need  A Hand pump prior to D/C . Mom aware of the [facility] and the [facility].    Maternal Data Formula Feeding for Exclusion: No Has patient been taught Hand Expression?: Yes Does the patient have breastfeeding experience prior to this delivery?: Yes  Feeding Feeding Type: Breast Fed Length of feed: 15 min  LATCH Score/Interventions Latch: Grasps breast easily, tongue down, lips flanged, rhythmical sucking.  Audible Swallowing: Spontaneous and intermittent  Type of Nipple: Everted at rest and after stimulation  Comfort (Breast/Nipple): Soft / non-tender     Hold (Positioning): Assistance needed to correctly position infant at breast and maintain latch. Intervention(s): Breastfeeding basics reviewed;Support Pillows;Position options;Skin to skin  LATCH Score: 9  Lactation Tools Discussed/Used WIC Program: Yes (per mom [place] )   Consult Status Consult Status: Follow-up Date: 08/29/13 Follow-up type: In-patient    Kathrin Greathouseorio, Navjot Pilgrim Ann 08/28/2013, 2:45 PM

## 2013-08-28 NOTE — Progress Notes (Signed)
Post Partum Day 1 (delivered at 2358 3/36) Subjective: no complaints, up ad lib, voiding and tolerating PO, small lochia, plans to breastfeed, bilateral tubal ligation in a month  Objective: Blood pressure 105/65, pulse 98, temperature 98.6 F (37 C), temperature source Oral, resp. rate 18, last menstrual period 12/02/2012, SpO2 97.00%, unknown if currently breastfeeding.  Physical Exam:  General: alert, cooperative and no distress Lochia:normal flow Chest: CTAB Heart: RRR no m/r/g Abdomen: +BS, soft, nontender,  Uterine Fundus: firm DVT Evaluation: No evidence of DVT seen on physical exam. Extremities: no edema   Recent Labs  08/27/13 2012  HGB 12.0  HCT 37.3    Assessment/Plan: Plan for discharge tomorrow, Breastfeeding and Lactation consult     LOS: 1 day   CRESENZO-DISHMAN,Jessica Hunter 08/28/2013, 7:13 AM

## 2013-08-28 NOTE — Anesthesia Postprocedure Evaluation (Signed)
  Anesthesia Post-op Note  Patient: Jessica Hunter  Procedure(s) Performed: * No procedures listed *  Patient Location: Mother/Baby  Anesthesia Type:Epidural  Level of Consciousness: awake and alert   Airway and Oxygen Therapy: Patient Spontanous Breathing  Post-op Pain: mild  Post-op Assessment: Patient's Cardiovascular Status Stable, Respiratory Function Stable, No signs of Nausea or vomiting, Pain level controlled, No headache, No residual numbness and No residual motor weakness  Post-op Vital Signs: stable  Complications: No apparent anesthesia complications

## 2013-08-29 MED ORDER — IBUPROFEN 600 MG PO TABS: 600.0000 mg | ORAL_TABLET | Freq: Four times a day (QID) | ORAL | Status: DC

## 2013-08-29 MED ORDER — MEDROXYPROGESTERONE ACETATE 150 MG/ML IM SUSP
150.0000 mg | Freq: Once | INTRAMUSCULAR | Status: AC
  Administered 2013-08-29: 150 mg via INTRAMUSCULAR
  Filled 2013-08-29: qty 1

## 2013-08-29 MED ORDER — FERROUS SULFATE 325 (65 FE) MG PO TABS: 325.0000 mg | ORAL_TABLET | Freq: Two times a day (BID) | ORAL | Status: DC

## 2013-08-29 NOTE — Discharge Summary (Signed)
Obstetric Discharge Summary Reason for Admission: onset of labor Prenatal Procedures: none Intrapartum Procedures: spontaneous vaginal delivery and GBS prophylaxis Postpartum Procedures: none Complications-Operative and Postpartum: none Hemoglobin  Date Value Ref Range Status  08/27/2013 12.0  12.0 - 15.0 g/dL Final     HCT  Date Value Ref Range Status  08/27/2013 37.3  36.0 - 46.0 % Final    Discharge Diagnoses: Term Pregnancy-delivered  Hospital Course:  Jessica Hunter is a 23 y.o. [id number] who presented with active labor.  She had a uncomplicated SVD. She was able to ambulate, tolerate PO and void normally. She was discharged home with instructions for postpartum care.    Delivery Note  Pt was noted to be C/C/+1 with an urge to push around 11:50. AROM with clear fluid. After a 3 minute 2nd stage, at 11:58 PM a viable female was delivered via Vaginal, Spontaneous Delivery (Presentation: Right Occiput Anterior). APGAR: 8/9, ; weight pending. After 3 minutes, the cord was double clamped and cut. The placenta separated spontaneously and delivered via CCT.  Placenta status: Intact, Spontaneous. Cord: 3 vessels with the following complications: loose nuchal cord, reduced before delivery  Anesthesia: Epidural  Episiotomy: None  Lacerations: None  Suture Repair: n/a  Est. Blood Loss (mL): 250  Mom to postpartum. Baby to Couplet care / Skin to Skin.  Physical Exam:  General: alert, cooperative and no distress Lochia: appropriate Uterine Fundus: firm DVT Evaluation: No evidence of DVT seen on physical exam. Negative Homan's sign. No cords or calf tenderness. No significant calf/ankle edema.  Discharge Information: Date: 08/29/2013 Activity: pelvic rest Diet: routine Medications: PNV and Ibuprofen Baby feeding: plans to breastfeed Contraception: bilateral tubal ligation, Signed papers in hospital - Needs preop visit to schedule BTL in 4 weeks Condition: stable Instructions: refer  to practice specific booklet Discharge to: home Follow-up Information   [facility] weeks. (Schedule BTL)    Contact information:   [address] [place] [phone]     Newborn Data: Live born female  Birth Weight: 7 lb 10.8 oz (3481 g) APGAR: 8, 9  Baby to stay in hospital for inadequate GBS prophylaxis   Jessica LowJames Joyner, MD [facility] PGY-1 08/29/2013, 9:11 AM

## 2013-08-29 NOTE — Discharge Instructions (Signed)

## 2013-08-29 NOTE — Discharge Summary (Signed)
Attestation of Attending Supervision of Obstetric Fellow: Evaluation and management procedures were performed by the Obstetric Fellow under my supervision and collaboration.  I have reviewed the Obstetric Fellow's note and chart, and I agree with the management and plan.  UGONNA  ANYANWU, MD, FACOG Attending Obstetrician & Gynecologist Faculty Practice, [facility]   

## 2013-09-03 ENCOUNTER — Other Ambulatory Visit: Payer: Medicaid Other

## 2013-09-15 NOTE — H&P (Signed)
Attestation of Attending Supervision of Advanced Practitioner (CNM/NP): Evaluation and management procedures were performed by the Advanced Practitioner under my supervision and collaboration. I have reviewed the Advanced Practitioner's note and chart, and I agree with the management and plan.  Lesly DukesKelly H Kelty Szafran 7:11 PM

## 2013-10-01 ENCOUNTER — Encounter: Payer: Self-pay | Admitting: Obstetrics & Gynecology

## 2013-10-01 ENCOUNTER — Ambulatory Visit (INDEPENDENT_AMBULATORY_CARE_PROVIDER_SITE_OTHER): Payer: Medicaid Other | Admitting: Obstetrics & Gynecology

## 2013-10-01 LAB — POCT PREGNANCY, URINE: Preg Test, Ur: NEGATIVE

## 2013-10-01 NOTE — Patient Instructions (Signed)

## 2013-10-01 NOTE — Progress Notes (Signed)
Patient ID: Jessica HoardDyisha M Schlarb, female   DOB: 08/15/1990, 23 y.o.   MRN: [id number] Subjective:     Jessica HoardDyisha M Mendonsa is a 23 y.o. female who presents for a postpartum visit. She is 6 weeks postpartum following a spontaneous vaginal delivery. I have fully reviewed the prenatal and intrapartum course. The delivery was at 39 gestational weeks. Outcome: spontaneous vaginal delivery. Anesthesia: epidural. Postpartum course has been uncomplicated. Baby's course has been uncomplicated. Baby is feeding by bottle. Bleeding daily spotting since delivery. Bowel function is normal. Bladder function is normal. Patient is not sexually active. Contraception method is Depo-Provera injections. Postpartum depression screening: negative.  The following portions of the patient's history were reviewed and updated as appropriate: allergies, current medications, past family history, past medical history, past social history, past surgical history and problem list.  Review of Systems A comprehensive review of systems was negative.   Objective:    BP 126/81  Pulse 79  Temp(Src) 98.5 F (36.9 C) (Oral)  Ht 5\' 6"  (1.676 m)  Wt 170 lb 12.8 oz (77.474 kg)  BMI 27.58 kg/m2  Breastfeeding? No  General:  alert and no distress            Assessment:  6 weeks postpartum exam. Pap smear not done at today's visit.   Plan:    1. Contraception: Depo-Provera injections 2. F/u 1 year for annual 3. Follow up in: 3 months or as needed.

## 2013-11-20 ENCOUNTER — Ambulatory Visit (INDEPENDENT_AMBULATORY_CARE_PROVIDER_SITE_OTHER): Payer: Medicaid Other

## 2013-11-20 VITALS — BP 125/86 | HR 72 | Wt 169.0 lb

## 2013-11-20 DIAGNOSIS — Z3049 Encounter for surveillance of other contraceptives: Secondary | ICD-10-CM

## 2013-11-20 MED ORDER — MEDROXYPROGESTERONE ACETATE 150 MG/ML IM SUSP
150.0000 mg | Freq: Once | INTRAMUSCULAR | Status: AC
Start: 2013-11-20 — End: 2013-11-20
  Administered 2013-11-20: 150 mg via INTRAMUSCULAR

## 2013-11-20 NOTE — Progress Notes (Signed)
Reviewed and agree.

## 2014-01-01 IMAGING — US US OB FOLLOW-UP
1 series · 12 of 28 positions shown · non-contrast
Comparison: none

[Series 1: us ob follow up · 12 of 44 slices shown]
[im 2/44]
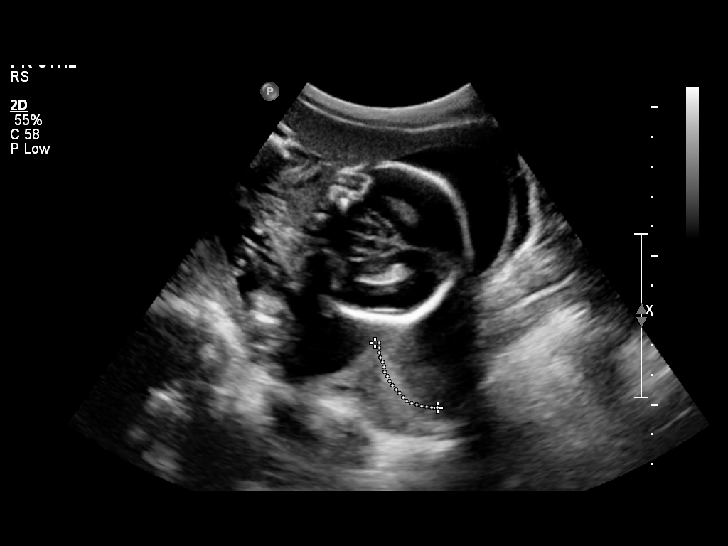
[im 5/44]
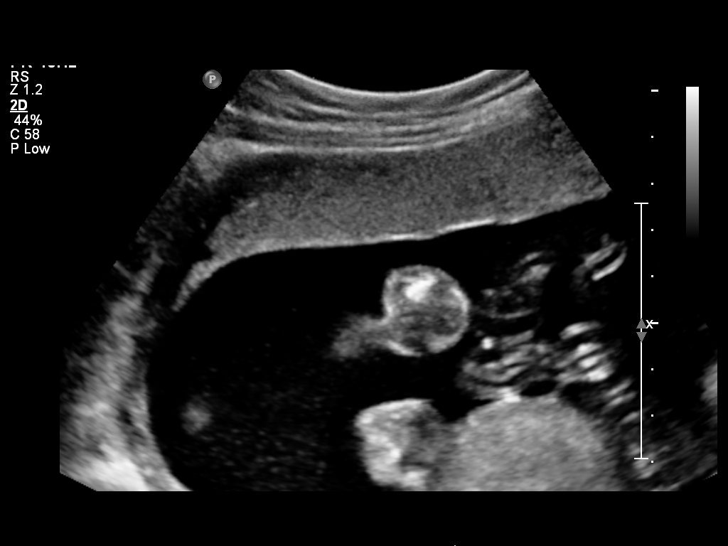
[im 8/44]
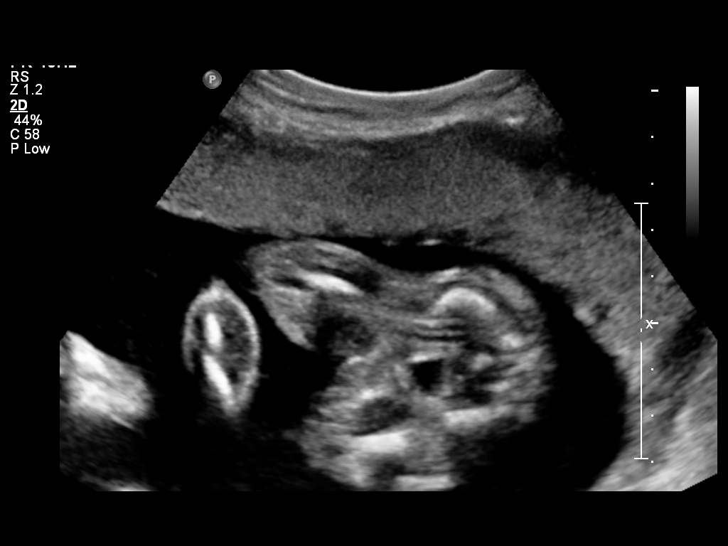
[im 13/44]
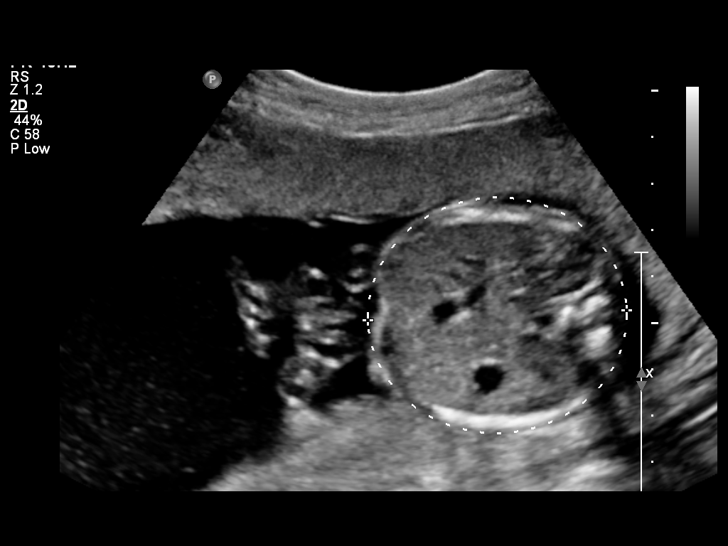
[im 16/44]
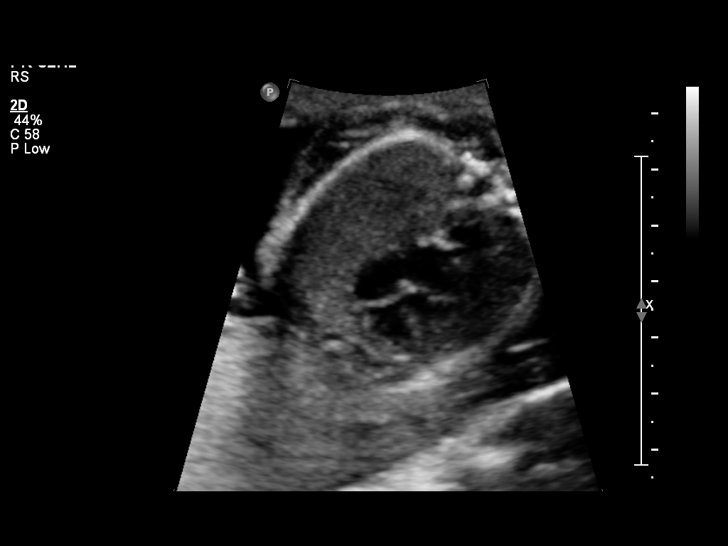
[im 20/44]
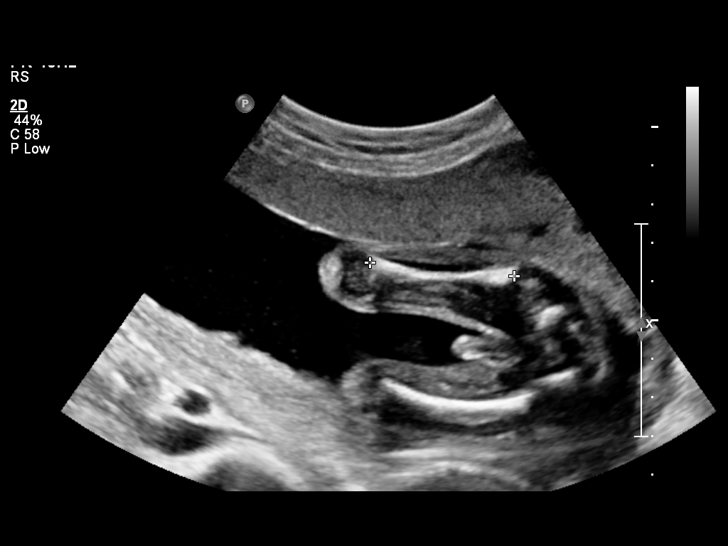
[im 24/44]
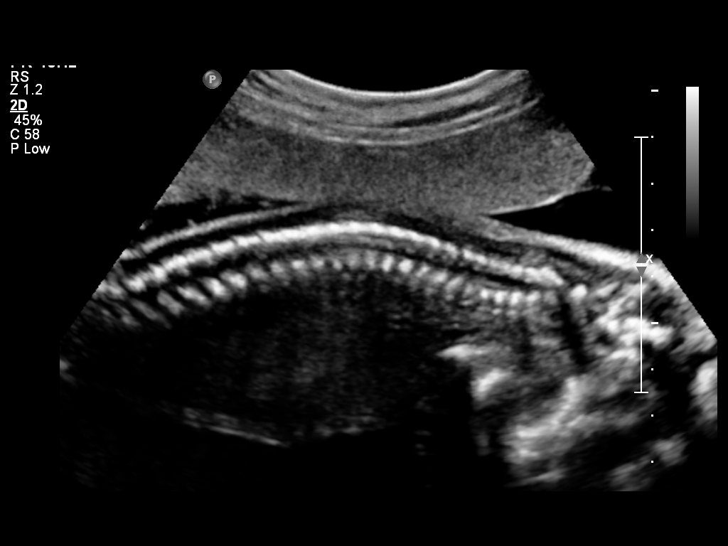
[im 28/44]
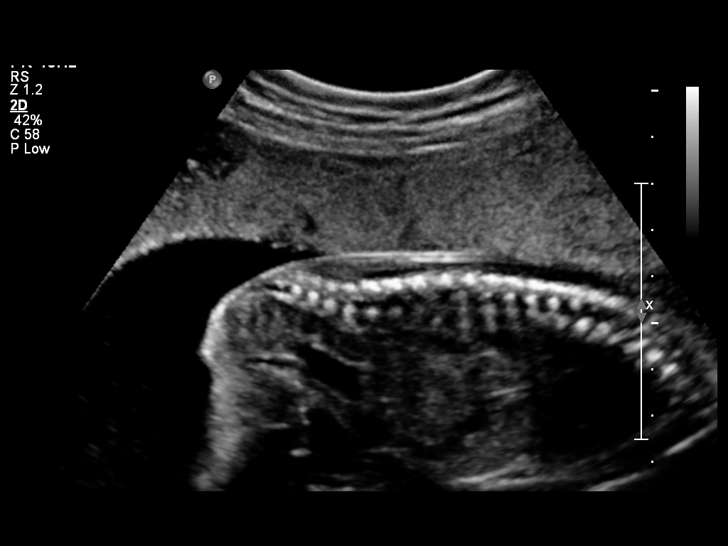
[im 31/44]
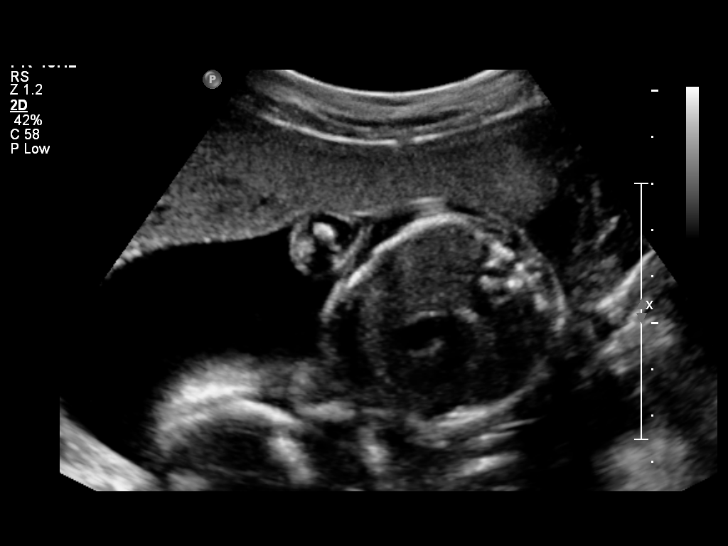
[im 36/44]
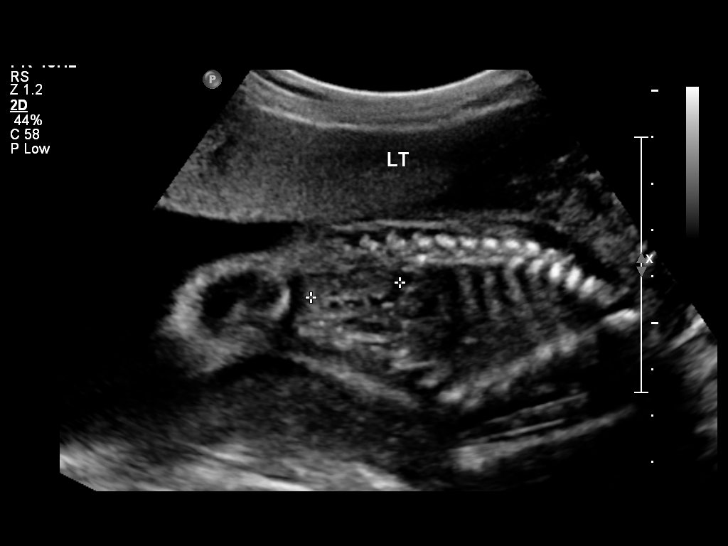
[im 39/44]
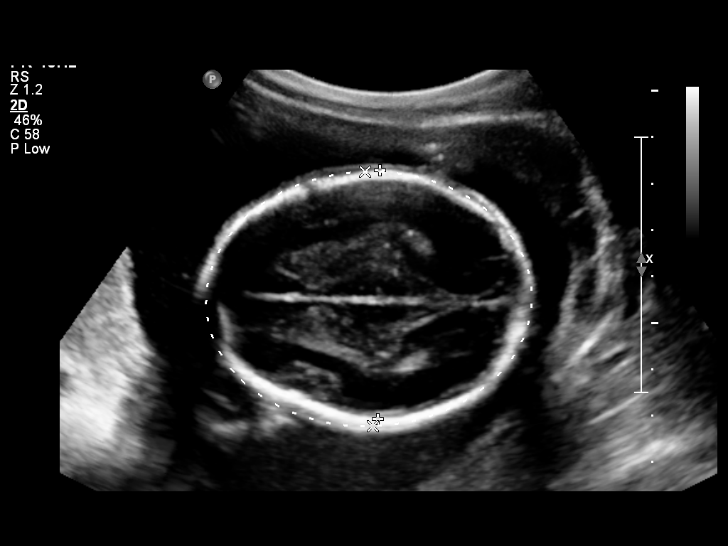
[im 42/44]
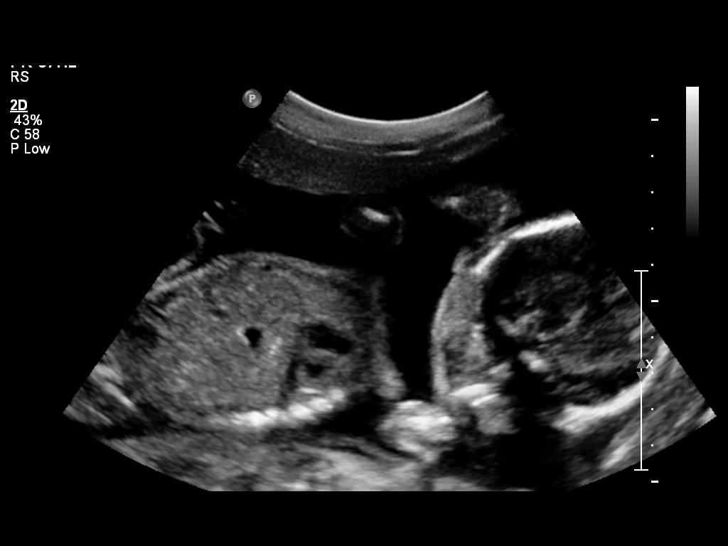

[12 of 28 positions shown; findings below may reference images not displayed]

OBSTETRICS REPORT
                      (Signed Final 06/18/2012 [DATE])

Service(s) Provided

 US OB FOLLOW UP                                       76816.1
Indications

 Follow-up incomplete fetal anatomic evaluation

Fetal Evaluation

 Num Of Fetuses:    1
 Fetal Heart Rate:  153                         bpm
 Cardiac Activity:  Observed
 Presentation:      Cephalic
 Placenta:          Anterior, above cervical os
 P. Cord            Previously Visualized
 Insertion:

 Amniotic Fluid
 AFI FV:      Subjectively within normal limits
                                             Larg Pckt:     5.1  cm
Biometry

 BPD:     53.4  mm    G. Age:   22w 2d                CI:        74.28   70 - 86
                                                      FL/HC:      18.9   18.4 -

 HC:     196.7  mm    G. Age:   21w 6d       52  %    HC/AC:      1.17   1.06 -

 AC:     167.8  mm    G. Age:   21w 5d       50  %    FL/BPD:     69.5   71 - 87
 FL:      37.1  mm    G. Age:   21w 6d       49  %    FL/AC:      22.1   20 - 24
 HUM:     35.9  mm    G. Age:   22w 4d       70  %

 Est. FW:     454  gm          1 lb      51  %
Gestational Age

 LMP:           21w 4d       Date:   01/19/12                 EDD:   10/25/12
 U/S Today:     22w 0d                                        EDD:   10/22/12
 Best:          21w 4d    Det. By:   LMP  (01/19/12)          EDD:   10/25/12
Anatomy

 Cranium:          Appears normal         Aortic Arch:      Previously seen
 Fetal Cavum:      Previously seen        Ductal Arch:      Previously seen
 Ventricles:       Appears normal         Diaphragm:        Appears Academi
 Choroid Plexus:   Previously seen        Stomach:          Appears normal, left
                                                            sided
 Cerebellum:       Previously seen        Abdomen:          Appears normal
 Posterior Fossa:  Previously seen        Abdominal Wall:   Previously seen
 Nuchal Fold:      Previously seen        Cord Vessels:     Previously seen
 Face:             Orbits appear          Kidneys:          Appear normal
                   normal
 Lips:             Previously seen        Bladder:          Appears normal
 Heart:            Appears normal         Spine:            Appears normal
                   (4CH, axis, and
                   situs)
 RVOT:             Not well visualized    Lower             Previously seen
                                          Extremities:
 LVOT:             Previously seen        Upper             Previously seen
                                          Extremities:

 Other:  Fetus appears to be a male. Heels and 5th digit previously visualized.
         Nasal bone prweviously visualized.
Cervix Uterus Adnexa

 Cervical Length:   3.4       cm

 Cervix:       Normal appearance by transabdominal scan.
 Uterus:       No abnormality visualized.
 Left Ovary:   No adnexal mass visualized.
 Right Ovary:  No adnexal mass visualized.

 Adnexa:     No abnormality visualized.
Impression

 Single live IUP in cephalic presentation.  Concordant
 measurements/assigned GA by LMP.
 Spine appears normal today. RVOT again not well visualized
 due to fetal position.
 No anomaly seen in visualized structures as listed above.

 questions or concerns.

## 2014-01-27 ENCOUNTER — Telehealth: Payer: Self-pay | Admitting: *Deleted

## 2014-01-27 NOTE — Telephone Encounter (Addendum)
Pt left message stating that the last time she got birth control she was given a higher dose to stop her bleeding however she has been bleeding since 7/4 and wants to be seen today.  Per chart review, pt received Depo Provera 150 mg IM on 3/28 (post partum) and 6/19 (clinic).  Her next scheduled dose is 9/4 @ 0800.  I returned call to pt and discussed her concern.  I explained that she received the same dose of medication on 3/28 and 6/19. I asked how much bleeding she is having. She stated that she is changing a tampon every 3 hrs on most days. Some days the bleeding is less but has been persistent since 7/4. I informed pt that this amount of bleeding is not urgent although it is probably annoying and frustrating. She does not require immediate evaluation. I advised that pt speak with a provider at her next injection appt on 9/4 to determine if she wants to continue this therapy. Pt agreed and voiced understanding.

## 2014-02-05 ENCOUNTER — Ambulatory Visit: Payer: Medicaid Other

## 2014-02-05 ENCOUNTER — Ambulatory Visit (INDEPENDENT_AMBULATORY_CARE_PROVIDER_SITE_OTHER): Payer: Medicaid Other | Admitting: Obstetrics & Gynecology

## 2014-02-05 VITALS — BP 133/73 | HR 85 | Temp 98.2°F | Wt 167.0 lb

## 2014-02-05 DIAGNOSIS — Z3049 Encounter for surveillance of other contraceptives: Secondary | ICD-10-CM

## 2014-02-05 DIAGNOSIS — N921 Excessive and frequent menstruation with irregular cycle: Secondary | ICD-10-CM | POA: Insufficient documentation

## 2014-02-05 MED ORDER — MEDROXYPROGESTERONE ACETATE 150 MG/ML IM SUSP
150.0000 mg | Freq: Once | INTRAMUSCULAR | Status: AC
Start: 2014-02-05 — End: 2014-02-05
  Administered 2014-02-05: 150 mg via INTRAMUSCULAR

## 2014-02-05 MED ORDER — MEDROXYPROGESTERONE ACETATE 10 MG PO TABS: 10.0000 mg | ORAL_TABLET | Freq: Every day | ORAL | Status: DC

## 2014-02-05 NOTE — Patient Instructions (Signed)

## 2014-02-05 NOTE — Progress Notes (Signed)
Patient ID: Jessica Hunter, female   DOB: 1991-05-22, 23 y.o.   MRN: [id number] Patient's last menstrual period was 12/05/2013. Daily BTB on depo provera since delivery, undecided about changing to another method. Would be willing to receive injection today and take supplemental provera 10 mg daily for 30 tabs, report if not improved.   Adam Phenix, MD 02/05/2014

## 2014-03-03 ENCOUNTER — Telehealth: Payer: Self-pay | Admitting: General Practice

## 2014-03-03 NOTE — Telephone Encounter (Signed)
Patient called and left message stating she thinks she may have a yeast infection, it hurts whenever she urinates but isn't having discharge. Called patient back and asked how long she has had the burning when she pees and if she has any pain. Patient states she has had the burning for a week and has cramps in her lower abdomen. Asked patient if her urine had looked dark or cloudy lately. Patient stated yes. Told patient that she may have a urinary tract infection that she can come by the office any time we are open to give us a urine sample and we can let her know if it looks like she has an infection. Patient verbalized understanding and states she will come tomorrow morning. Patient had no other questions

## 2014-04-05 ENCOUNTER — Encounter: Payer: Self-pay | Admitting: Obstetrics & Gynecology

## 2014-04-23 ENCOUNTER — Ambulatory Visit: Payer: Medicaid Other

## 2014-04-26 ENCOUNTER — Ambulatory Visit: Payer: Medicaid Other

## 2014-04-28 ENCOUNTER — Ambulatory Visit (INDEPENDENT_AMBULATORY_CARE_PROVIDER_SITE_OTHER): Payer: Medicaid Other | Admitting: *Deleted

## 2014-04-28 VITALS — BP 121/76 | HR 81 | Temp 98.1°F | Wt 167.8 lb

## 2014-04-28 DIAGNOSIS — Z3042 Encounter for surveillance of injectable contraceptive: Secondary | ICD-10-CM

## 2014-04-28 MED ORDER — MEDROXYPROGESTERONE ACETATE 150 MG/ML IM SUSP
150.0000 mg | Freq: Once | INTRAMUSCULAR | Status: AC
  Administered 2014-04-28: 150 mg via INTRAMUSCULAR

## 2014-04-28 NOTE — Progress Notes (Signed)
Depo Provera 150 mg IM

## 2014-07-14 ENCOUNTER — Ambulatory Visit: Payer: Medicaid Other

## 2014-09-23 IMAGING — US US OB LIMITED
2 series · 13 of 24 positions shown · non-contrast
Comparison: none

[Series 1: us ob comp less 14 wks · 12 of 23 slices shown (1 of 2)]
[im 1/23]
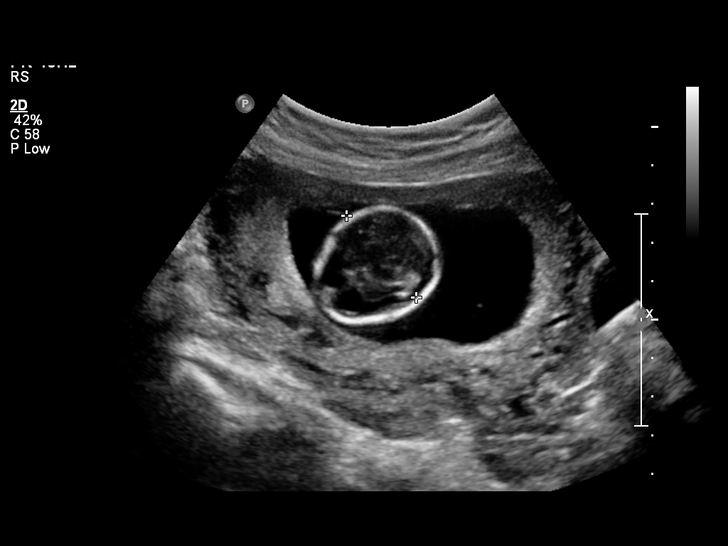
[im 3/23]
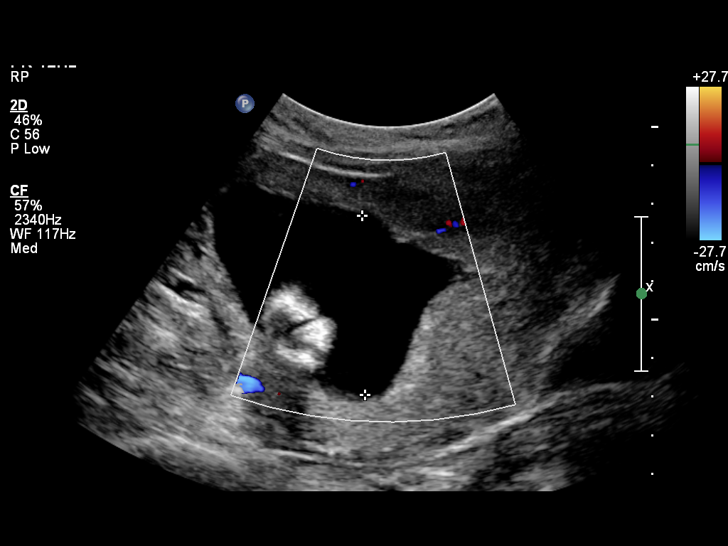
[im 5/23]
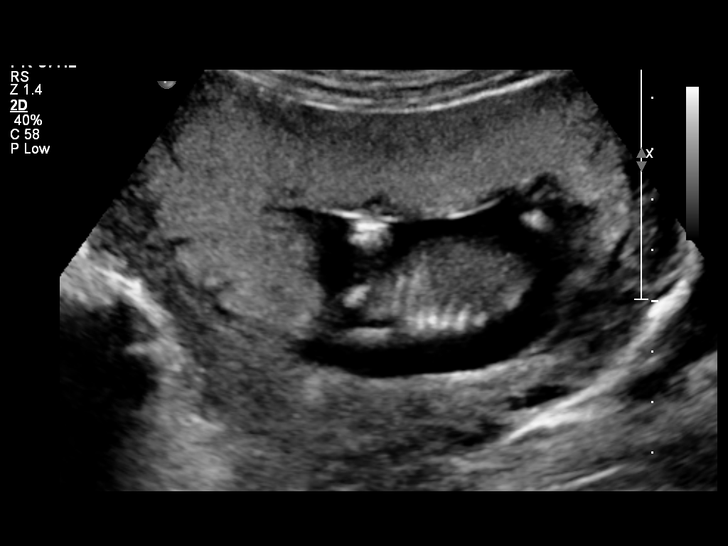
[im 7/23]
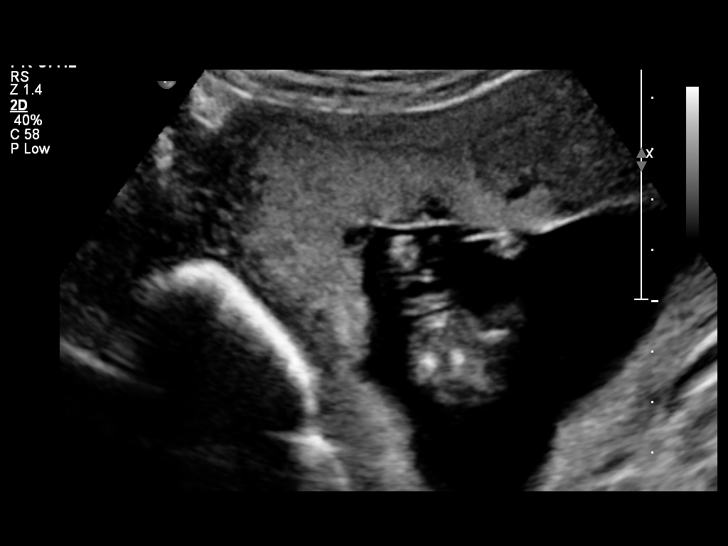
[im 9/23]
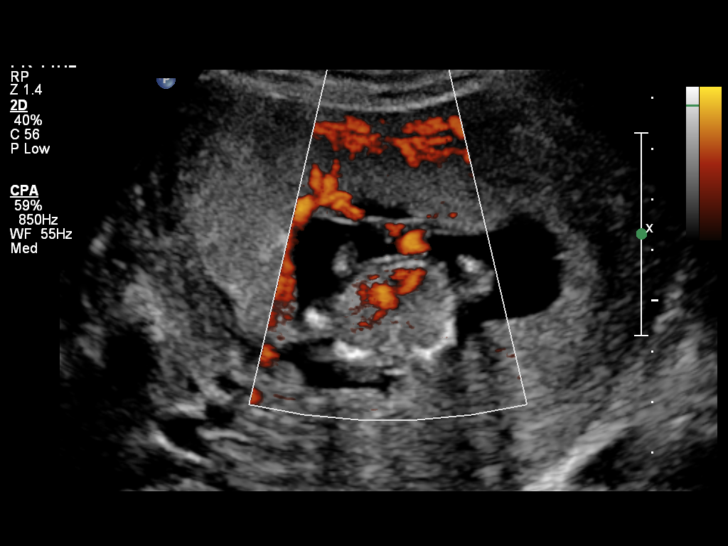
[im 11/23]
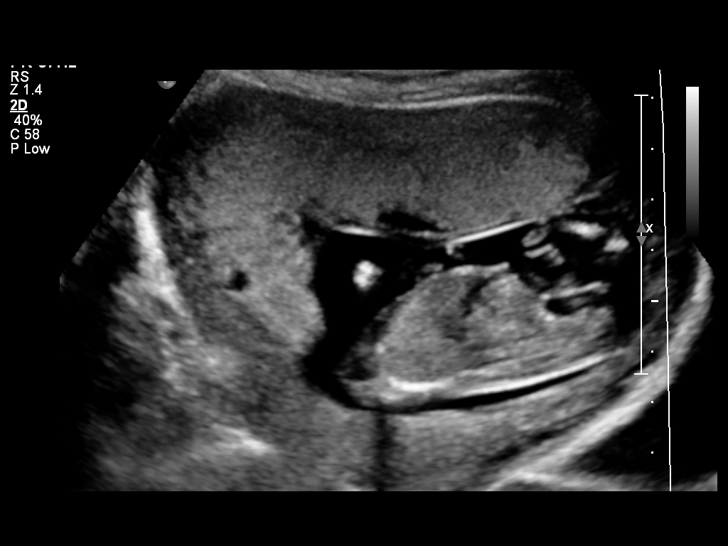
[im 13/23]
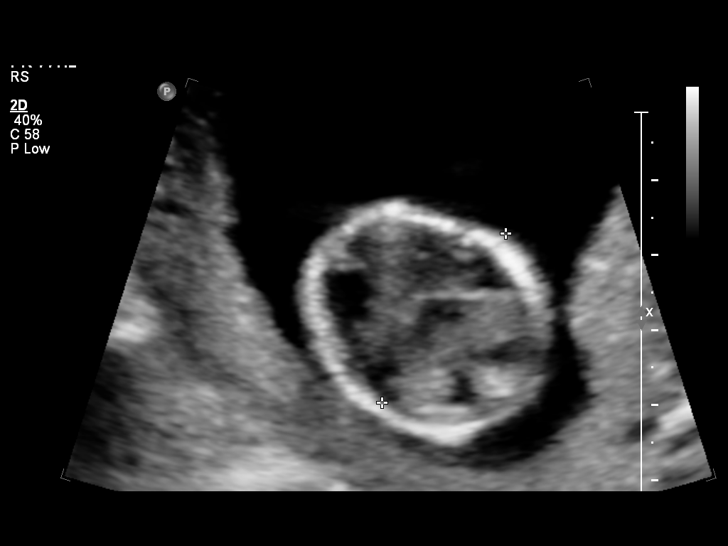
[im 14/23]
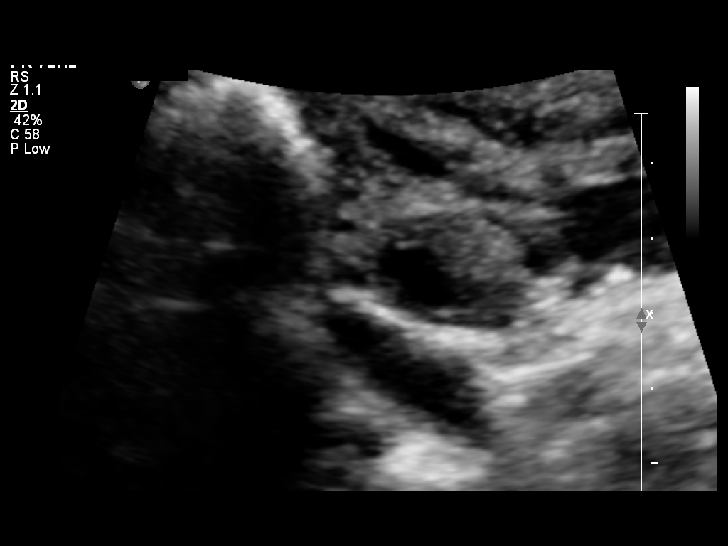
[im 16/23]
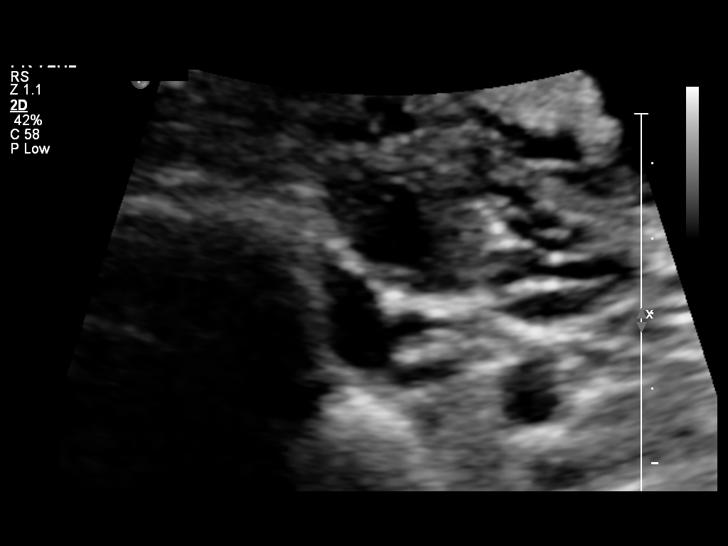
[im 18/23]
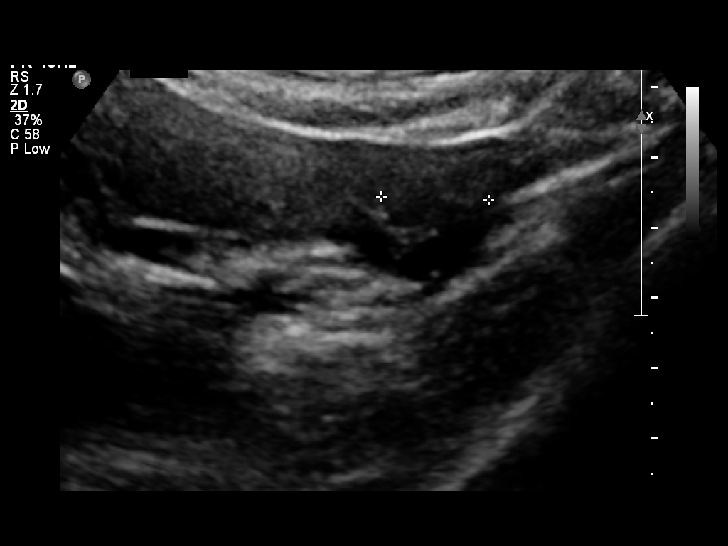
[im 20/23]
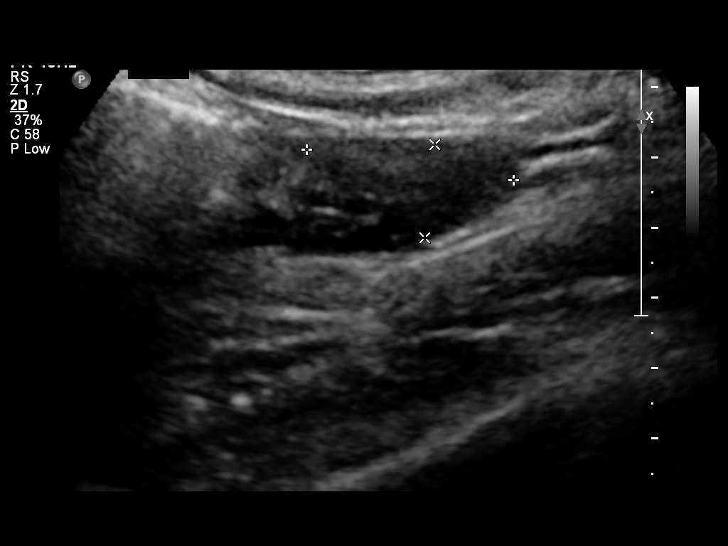
[im 22/23]
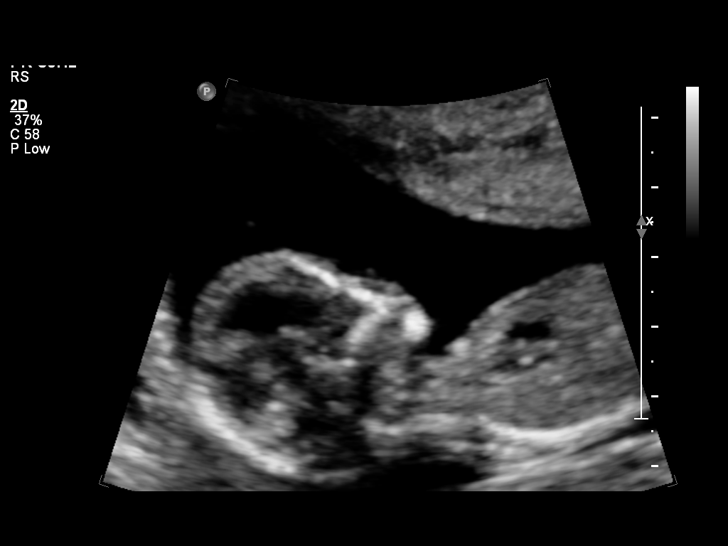

[Series 1: us ob comp less 14 wks · 1 of 1 slices shown (2 of 2)]
[im 1/1]
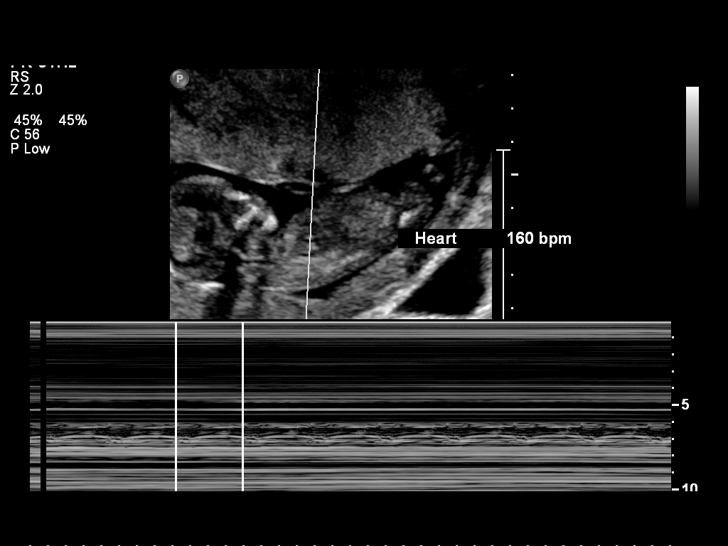

[13 of 24 positions shown; findings below may reference images not displayed]

OBSTETRICS REPORT
                      (Signed Final 03/10/2013 [DATE])

Service(s) Provided

 [HOSPITAL]                                         76815.0
Indications

 Unsure of LMP;  Establish Gestational [AGE]

Fetal Evaluation

 Num Of Fetuses:    1
 Fetal Heart Rate:  160                          bpm
 Cardiac Activity:  Observed
 Presentation:      Breech
 Placenta:          Anterior, above cervical os
 P. Cord            Visualized, central
 Insertion:

 Amniotic Fluid
 AFI FV:      Subjectively within normal limits
                                             Larg Pckt:    4.61  cm
Biometry

 BPD:     27.3  mm     G. Age:  14w 6d
Gestational Age

 LMP:           14w 0d        Date:  12/02/12                 EDD:   09/08/13
 U/S Today:     14w 6d                                        EDD:   09/02/13
 Best:          14w 0d     Det. By:  LMP  (12/02/12)          EDD:   09/08/13
Cervix Uterus Adnexa

 Cervical Length:    3        cm

 Cervix:       Normal appearance by transabdominal scan.
 Left Ovary:    Within normal limits.
 Right Ovary:   Within normal limits. Small corpus luteum noted.

 Adnexa:     No abnormality visualized.
Impression

 IUP at 14+0 weeks by LMP
 No gross abnormalities identified
 Normal amniotic fluid volume
Recommendations

 Needs official ultrasound with all measurements before EDC
 can be established

 questions or concerns.

## 2014-10-06 ENCOUNTER — Ambulatory Visit (INDEPENDENT_AMBULATORY_CARE_PROVIDER_SITE_OTHER): Payer: Medicaid Other | Admitting: General Practice

## 2014-10-06 VITALS — BP 112/88 | HR 83 | Temp 98.1°F | Ht 66.0 in | Wt 161.1 lb

## 2014-10-06 DIAGNOSIS — Z30013 Encounter for initial prescription of injectable contraceptive: Secondary | ICD-10-CM

## 2014-10-06 DIAGNOSIS — Z3202 Encounter for pregnancy test, result negative: Secondary | ICD-10-CM

## 2014-10-06 LAB — POCT PREGNANCY, URINE: PREG TEST UR: NEGATIVE

## 2014-10-06 MED ORDER — MEDROXYPROGESTERONE ACETATE 150 MG/ML IM SUSP
150.0000 mg | Freq: Once | INTRAMUSCULAR | Status: AC
  Administered 2014-10-06: 150 mg via INTRAMUSCULAR

## 2014-10-06 NOTE — Progress Notes (Signed)
Patient is 10 weeks late for depo. Patient reports last unprotected sex >2 weeks ago. Obtained negative upt today. Discussed with patient considering a long term birth control option as that will not require her to come in every 3 months. Patient expresses interest in nexplanon. brochures given out on nexplanon and liletta.

## 2014-11-26 ENCOUNTER — Ambulatory Visit: Payer: Medicaid Other | Admitting: Obstetrics & Gynecology

## 2014-12-23 ENCOUNTER — Ambulatory Visit (INDEPENDENT_AMBULATORY_CARE_PROVIDER_SITE_OTHER): Payer: Medicaid Other | Admitting: Obstetrics & Gynecology

## 2014-12-23 ENCOUNTER — Encounter: Payer: Self-pay | Admitting: Obstetrics & Gynecology

## 2014-12-23 ENCOUNTER — Other Ambulatory Visit (HOSPITAL_COMMUNITY)
Admission: RE | Admit: 2014-12-23 | Discharge: 2014-12-23 | Disposition: A | Discharge status: Home / Self Care | Payer: Medicaid Other | Source: Ambulatory Visit | Attending: Obstetrics & Gynecology | Admitting: Obstetrics & Gynecology

## 2014-12-23 VITALS — BP 137/80 | HR 93 | Temp 98.5°F | Ht 66.0 in | Wt 151.5 lb

## 2014-12-23 DIAGNOSIS — Z118 Encounter for screening for other infectious and parasitic diseases: Secondary | ICD-10-CM

## 2014-12-23 DIAGNOSIS — Z0000 Encounter for general adult medical examination without abnormal findings: Secondary | ICD-10-CM

## 2014-12-23 DIAGNOSIS — Z113 Encounter for screening for infections with a predominantly sexual mode of transmission: Secondary | ICD-10-CM | POA: Diagnosis not present

## 2014-12-23 DIAGNOSIS — Z01411 Encounter for gynecological examination (general) (routine) with abnormal findings: Secondary | ICD-10-CM | POA: Diagnosis present

## 2014-12-23 DIAGNOSIS — Z30019 Encounter for initial prescription of contraceptives, unspecified: Secondary | ICD-10-CM

## 2014-12-23 DIAGNOSIS — Z124 Encounter for screening for malignant neoplasm of cervix: Secondary | ICD-10-CM

## 2014-12-23 DIAGNOSIS — Z3202 Encounter for pregnancy test, result negative: Secondary | ICD-10-CM | POA: Diagnosis not present

## 2014-12-23 DIAGNOSIS — Z3049 Encounter for surveillance of other contraceptives: Secondary | ICD-10-CM

## 2014-12-23 LAB — POCT PREGNANCY, URINE: Preg Test, Ur: NEGATIVE

## 2014-12-23 MED ORDER — ETONOGESTREL 68 MG ~~LOC~~ IMPL
68.0000 mg | DRUG_IMPLANT | Freq: Once | SUBCUTANEOUS | Status: AC
  Administered 2014-12-23: 68 mg via SUBCUTANEOUS

## 2014-12-23 NOTE — Progress Notes (Signed)
   Subjective:    Patient ID: Jessica Hunter, female    DOB: Jan 15, 1991, 24 y.o.   MRN: [id number]  HPI  24 yo S AAP4 is tired of coming every month for her depo provera injections and wants Nexplanon. She is aware that iregular bleeding (like she has with depo provera) is common with Nexplanon. I told her that we do not remove it for that indication.  Review of Systems     Objective:   Physical Exam [id number] EG, Vulva, vagina and cervix appear normal. Pap smear obtained UPT was negative. Consent was signed. Time out procedure was done. Her left arm was prepped with betadine and infiltrated with 3 cc of 1% lidocaine. After adequate anesthesia was assured, the Nexplanon device was placed according to standard of care. Her arm was hemostatic and was bandaged. She tolerated the procedure well.        Assessment & Plan:  Preventative care- Pap smear with GC/CT testing Contraception- Nexplanon RTC 1 year/prn sooner

## 2014-12-24 LAB — CYTOLOGY - PAP

## 2014-12-31 IMAGING — US US OB FOLLOW-UP
1 series · 12 of 28 positions shown · non-contrast
Comparison: none

[Series 1: us ob follow up · 12 of 70 slices shown]
[im 3/70]
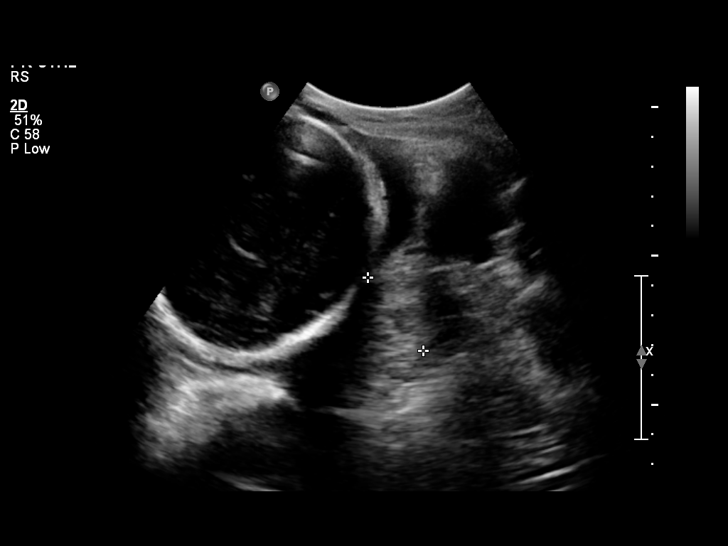
[im 8/70]
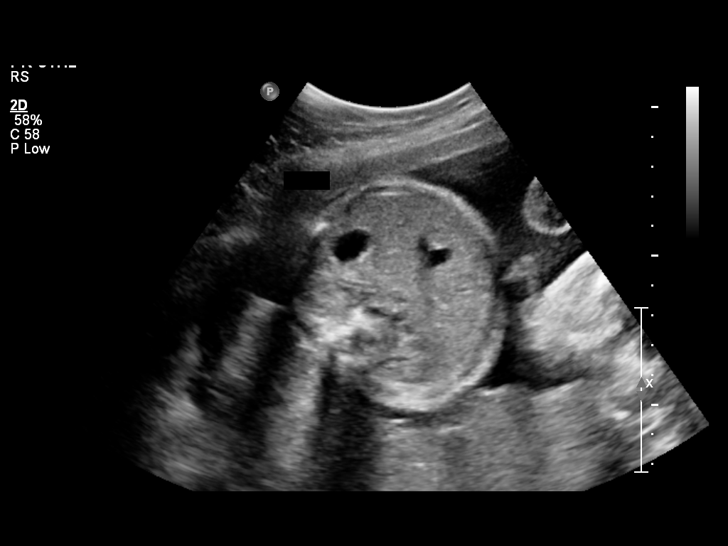
[im 13/70]
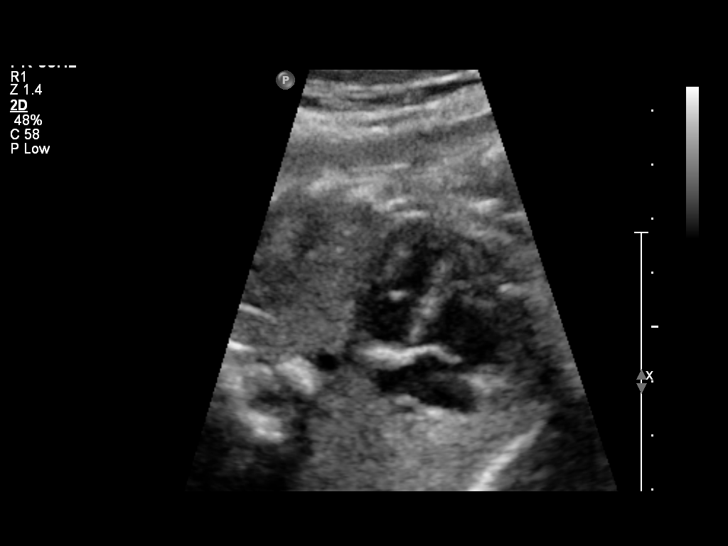
[im 21/70]
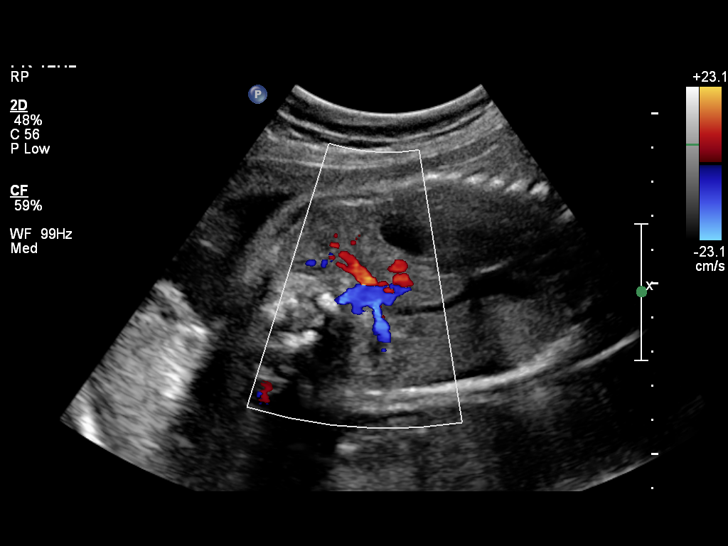
[im 26/70]
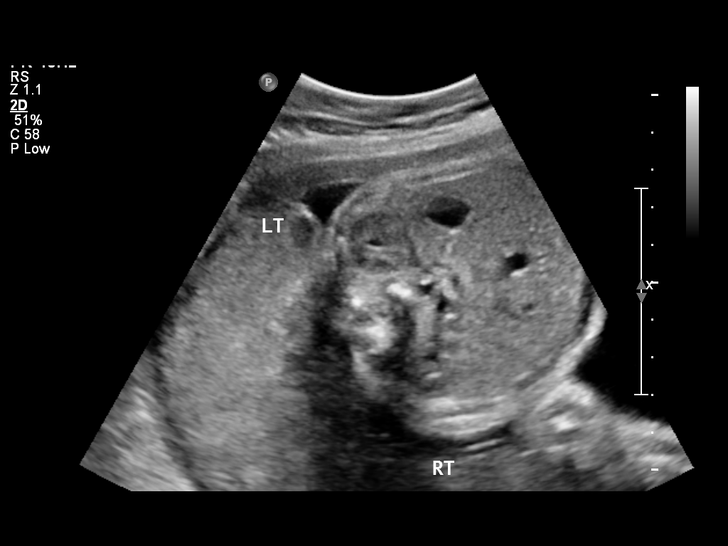
[im 31/70]
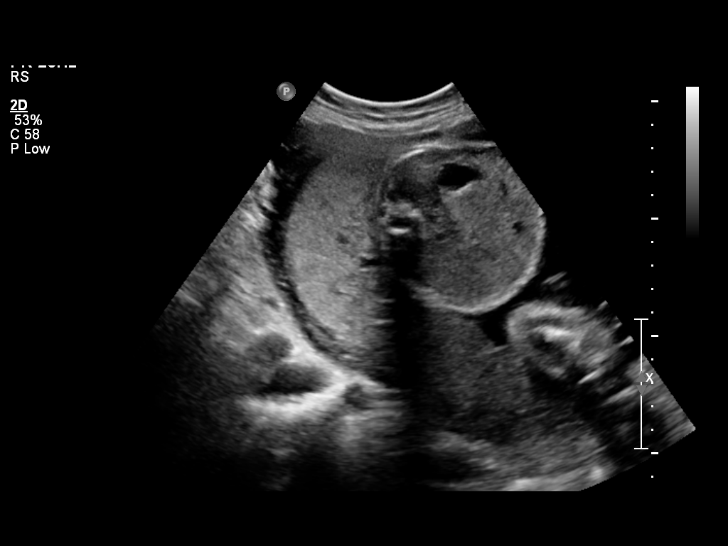
[im 39/70]
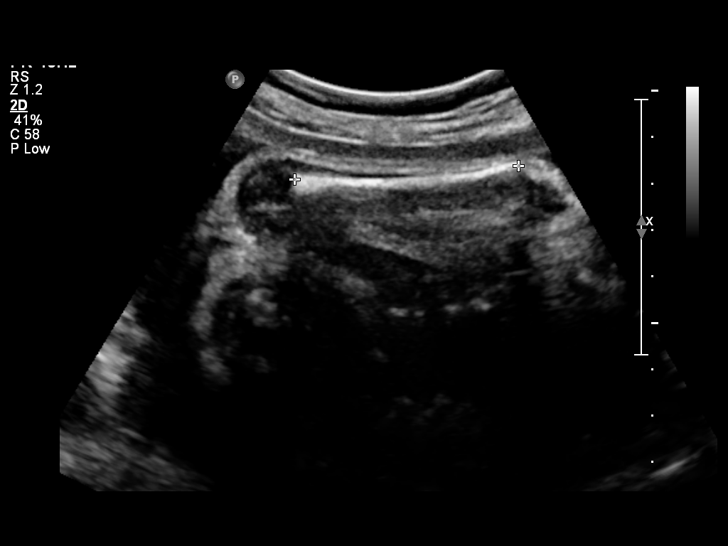
[im 44/70]
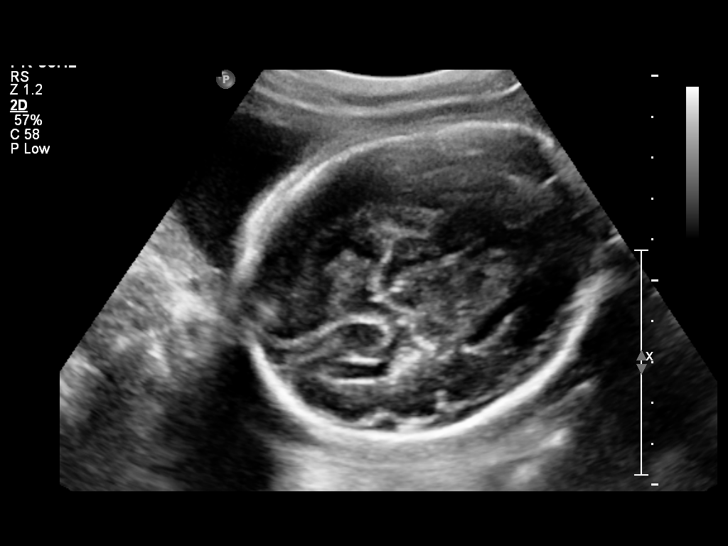
[im 49/70]
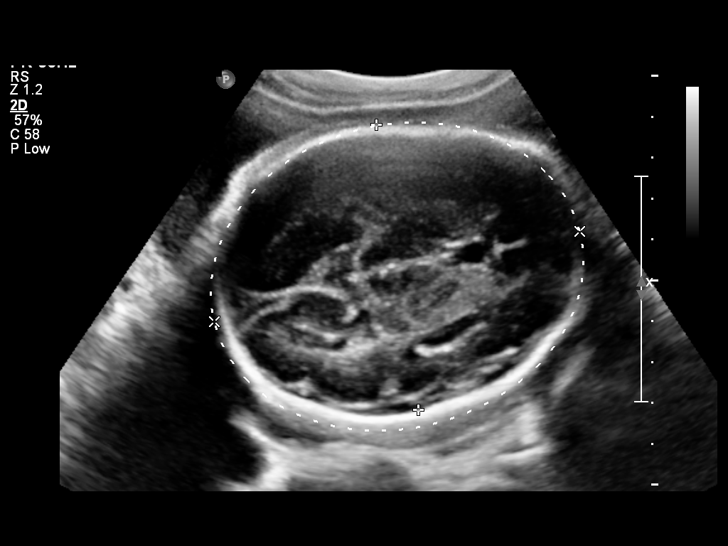
[im 57/70]
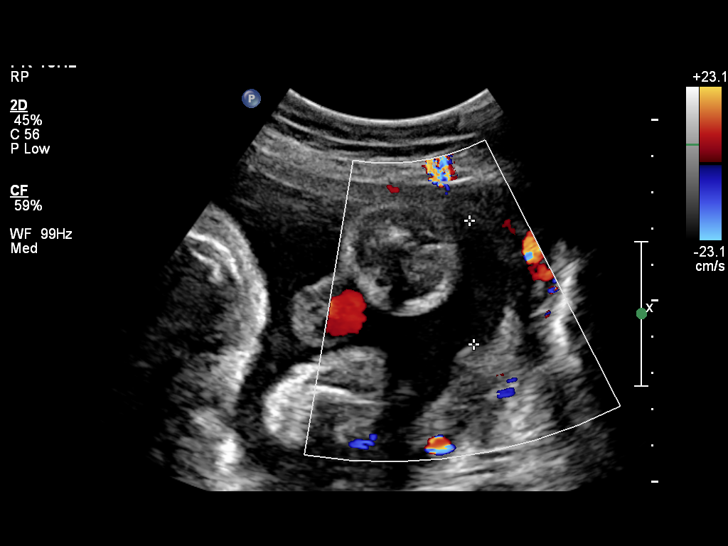
[im 62/70]
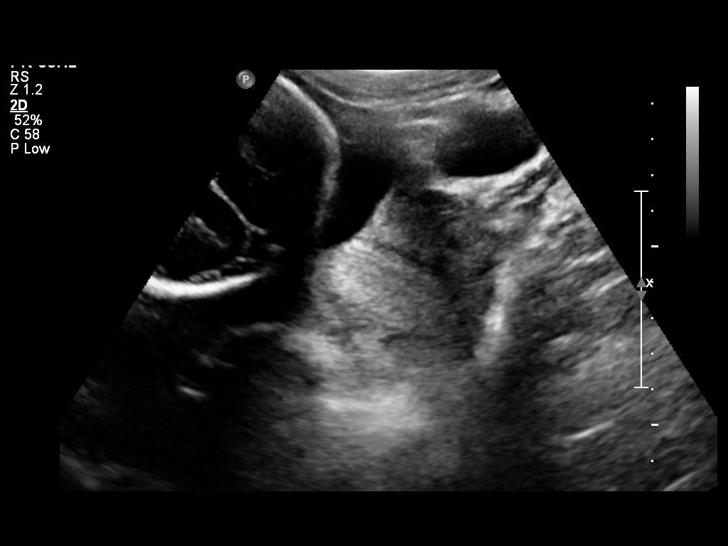
[im 67/70]
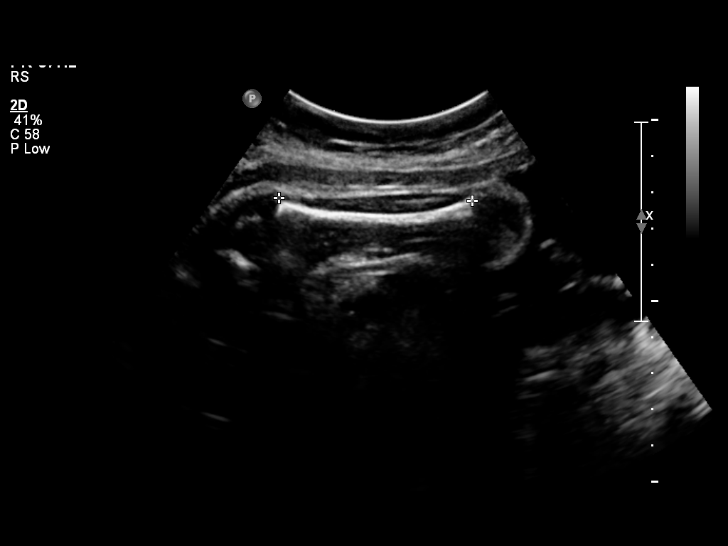

[12 of 28 positions shown; findings below may reference images not displayed]

OBSTETRICS REPORT
                      (Signed Final 06/17/2013 [DATE])

Service(s) Provided

 US OB FOLLOW UP                                       76816.1
Indications

 Growth
 Echogenic intracardiac focus of the heart  (EIF) LV
 Choroid plexus cyst (bilat)
 Follow-up incomplete fetal anatomic evaluation
Fetal Evaluation

 Num Of Fetuses:    1
 Fetal Heart Rate:  150                          bpm
 Cardiac Activity:  Observed
 Presentation:      Cephalic
 Placenta:          Posterior Fundal, above
                    cervical os
 P. Cord            Previously Visualized
 Insertion:

 Amniotic Fluid
 AFI FV:      Subjectively within normal limits
 AFI Sum:     16.63   cm       61  %Tile     Larg Pckt:    4.88  cm
 RUQ:   3.87    cm   RLQ:    4.48   cm    LUQ:   4.88    cm   LLQ:    3.4    cm
Biometry

 BPD:     70.9  mm     G. Age:  28w 3d                CI:        72.48   70 - 86
                                                      FL/HC:      20.3   19.6 -

 HC:     264.9  mm     G. Age:  28w 6d       22  %    HC/AC:      1.09   0.99 -

 AC:     242.3  mm     G. Age:  28w 4d       37  %    FL/BPD:     75.9   71 - 87
 FL:      53.8  mm     G. Age:  28w 4d       30  %    FL/AC:      22.2   20 - 24
 HUM:     48.3  mm     G. Age:  28w 2d       37  %

 Est. FW:    5696  gm    2 lb 12 oz      47  %
Gestational Age

 LMP:           28w 1d        Date:  12/02/12                 EDD:   09/08/13
 U/S Today:     28w 4d                                        EDD:   09/05/13
 Best:          28w 5d     Det. By:  U/S (04/03/13)           EDD:   09/04/13
Anatomy

 Cranium:          Appears normal         Aortic Arch:      Appears normal
 Fetal Cavum:      Previously seen        Ductal Arch:      Previously seen
 Ventricles:       Appears normal         Diaphragm:        Appears normal
 Choroid Plexus:   Resolved CPC           Stomach:          Appears normal, left
                   (bilat)                                  sided
 Cerebellum:       Previously seen        Abdomen:          Appears normal
 Posterior Fossa:  Previously seen        Abdominal Wall:   Appears nml (cord
                                                            insert, abd wall)
 Nuchal Fold:      Not applicable (>20    Cord Vessels:     Previously seen
                   wks GA)
 Face:             Orbits and profile     Kidneys:          Appear normal
                   previously seen
 Lips:             Previously seen        Bladder:          Appears normal
 Palate:           Not well visualized    Spine:            Previously seen
 Heart:            Echogenic focus        Lower             Previously seen
                   in LV
                                          Extremities:
 RVOT:             Appears normal         Upper             Previously seen
                                          Extremities:
 LVOT:             Appears normal

 Other:  Female gender. Heels and 5th digit previously visualized.
Targeted Anatomy

 Fetal Central Nervous System
 Lat. Ventricles:
Cervix Uterus Adnexa

 Cervical Length:    3.4      cm

 Cervix:       Normal appearance by transabdominal scan.
 Uterus:       No abnormality visualized.
 Cul De Sac:   No free fluid seen.

 Left Ovary:    Previously seen.
 Right Ovary:   Previously seen

 Adnexa:     No abnormality visualized. No adnexal mass visualized.
Impression

 Single IUP at 28 [DATE] weeks
 EIF, bilateral choroid prexus cysts previosly noted - low risk
 NIPS (cell free fetal DNA)
 The previously noted choroid plexus cysts are not visualized
 on today's study
 Echogenic intracardiac focus is again noted
 Interval anatomy is otherwise within normal limits
 Fetal growth is appropriate (47th %tile)
 Normal amniotic fluid volume
Recommendations

 Follow-up ultrasounds as clinically indicated.

 questions or concerns.

## 2015-01-03 ENCOUNTER — Telehealth: Payer: Self-pay | Admitting: *Deleted

## 2015-01-03 NOTE — Telephone Encounter (Signed)
Received a call from patient left on nurse line 01/03/15 at 0845.  Patient requests a return call with her test results.  Spoke with patient via phone.  Pap, GC/Chlamydia results negative and given to patient.  Patient states understanding and has no further questions.

## 2016-12-09 ENCOUNTER — Encounter (HOSPITAL_COMMUNITY): Payer: Self-pay | Admitting: Emergency Medicine

## 2016-12-09 ENCOUNTER — Ambulatory Visit (HOSPITAL_COMMUNITY)
Admission: EM | Admit: 2016-12-09 | Discharge: 2016-12-09 | Disposition: A | Discharge status: Home / Self Care | Payer: Medicaid Other | Attending: Family Medicine | Admitting: Family Medicine

## 2016-12-09 DIAGNOSIS — J039 Acute tonsillitis, unspecified: Secondary | ICD-10-CM | POA: Diagnosis not present

## 2016-12-09 DIAGNOSIS — J029 Acute pharyngitis, unspecified: Secondary | ICD-10-CM | POA: Diagnosis not present

## 2016-12-09 MED ORDER — AMOXICILLIN 500 MG PO CAPS: 1000.0000 mg | ORAL_CAPSULE | Freq: Two times a day (BID) | ORAL | 0 refills | Status: DC

## 2016-12-09 NOTE — Discharge Instructions (Signed)
Take the amoxicillin until it is all gone. If you are not getting better in 3 days that are not getting worse this may be because the infection may be due to a virus. If you are getting worse seek medical attention. Drink plenty of clear liquids, stay well-hydrated. Cepacol lozenges for sore throat pain. Ibuprofen 600 mg every 6 hours for sore throat pain, fever and discomfort. May also take Tylenol 650 mg every 4 hours as needed.

## 2016-12-09 NOTE — ED Notes (Signed)
Bed: UC07 Expected date:  Expected time:  Means of arrival:  Comments: Bleached

## 2016-12-09 NOTE — ED Triage Notes (Signed)
The patient presented to the [facility] with a complaint of a sore throat x 2 days. The patient denied any fever.  

## 2016-12-09 NOTE — ED Provider Notes (Signed)
CSN: [id number]     Arrival date & time 12/09/16  1217 History   First MD Initiated Contact with Patient 12/09/16 1319     Chief Complaint  Patient presents with  . Sore Throat   (Consider location/radiation/quality/duration/timing/severity/associated sxs/prior Treatment) 26 year old female with rapid progression of sore throat since chest today at work. Denies any known fever. She does have some achiness in the years. She has taken Tylenol at does not help. Nothing seems to make it better. Associated symptoms include fatigue and malaise.      Past Medical History:  Diagnosis Date  . History of postpartum eclampsia in 2009 03/03/2013   Occurred PPD#6, was witness in  [facility] ER, treated with magnesium sulfate and transferred to [facility] where she was admitted for two days (07/19/07 -07/21/07)   . Pregnancy induced hypertension    with first preg  . Seizures (HCC)    PP with first preg   Past Surgical History:  Procedure Laterality Date  . NO PAST SURGERIES     Family History  Problem Relation Age of Onset  . Other Neg Hx   . Hearing loss Neg Hx   . Diabetes Brother   . Hypertension Father    Social History  Substance Use Topics  . Smoking status: Former Games developer  . Smokeless tobacco: Never Used     Comment: quit with last preg  . Alcohol use No   OB History    Gravida Para Term Preterm AB Living   4 4 4     4    SAB TAB Ectopic Multiple Live Births           4     Review of Systems  Constitutional: Positive for activity change, appetite change and fatigue. Negative for fever.  HENT: Positive for sore throat. Negative for congestion, ear discharge and postnasal drip.   Respiratory: Negative.   Gastrointestinal: Negative.   Skin: Negative.   Neurological: Negative.   All other systems reviewed and are negative.   Allergies  Patient has no known allergies.  Home Medications   Prior to Admission medications   Medication Sig Start Date End Date Taking? Authorizing Provider   etonogestrel (NEXPLANON) 68 MG IMPL implant 1 each by Subdermal route once.   Yes [provider]  amoxicillin (AMOXIL) 500 MG capsule Take 2 capsules (1,000 mg total) by mouth 2 (two) times daily. 12/09/16   Hayden Rasmussen, NP   Meds Ordered and Administered this Visit  Medications - No data to display  BP 105/72 (BP Location: Right Arm)   Pulse 76   Temp 99.1 F (37.3 C) (Oral)   Resp 18   SpO2 100%  No data found.   Physical Exam  Constitutional: She is oriented to person, place, and time. She appears well-developed and well-nourished. No distress.  HENT:  Head: Normocephalic and atraumatic.  Mouth/Throat: Oropharyngeal exudate present.  Oropharynx with enlarged erythematous tonsils with exudate, posterior pharynx with erythema. Malodorous breath similar to that which is sensed with bacterial infection. Airway widely patent.  Neck: Normal range of motion. Neck supple.  Couple small lower anterior chain lymph node enlargement and tenderness.  Cardiovascular: Normal rate, regular rhythm and normal heart sounds.   Pulmonary/Chest: Effort normal.  Neurological: She is alert and oriented to person, place, and time.  Skin: Skin is warm and dry.  Psychiatric: She has a normal mood and affect.  Nursing note and vitals reviewed.   Urgent Care Course     Procedures (including  critical care time)  Labs Review Labs Reviewed - No data to display  Imaging Review No results found.   Visual Acuity Review  Right Eye Distance:   Left Eye Distance:   Bilateral Distance:    Right Eye Near:   Left Eye Near:    Bilateral Near:         MDM   1. Exudative tonsillitis   2. Acute pharyngitis, unspecified etiology    Take the amoxicillin until it is all gone. If you are not getting better in 3 days that are not getting worse this may be because the infection may be due to a virus. If you are getting worse seek medical attention. Drink plenty of clear liquids, stay  well-hydrated. Cepacol lozenges for sore throat pain. Ibuprofen 600 mg every 6 hours for sore throat pain, fever and discomfort. May also take Tylenol 650 mg every 4 hours as needed. Meds ordered this encounter  Medications  . etonogestrel (NEXPLANON) 68 MG IMPL implant    Sig: 1 each by Subdermal route once.  Marland Kitchen. amoxicillin (AMOXIL) 500 MG capsule    Sig: Take 2 capsules (1,000 mg total) by mouth 2 (two) times daily.    Dispense:  40 capsule    Refill:  0    Order Specific Question:   Supervising Provider    Answer:   Mardella LaymanHAGLER, BRIAN [[id number]]       Hayden RasmussenMabe, Terance Pomplun, NP 12/09/16 1333

## 2017-07-18 ENCOUNTER — Ambulatory Visit: Payer: Self-pay | Admitting: Obstetrics & Gynecology

## 2017-11-16 DIAGNOSIS — Z87891 Personal history of nicotine dependence: Secondary | ICD-10-CM | POA: Diagnosis not present

## 2017-11-16 DIAGNOSIS — R300 Dysuria: Secondary | ICD-10-CM | POA: Diagnosis present

## 2017-11-16 DIAGNOSIS — R509 Fever, unspecified: Secondary | ICD-10-CM | POA: Diagnosis not present

## 2017-11-16 DIAGNOSIS — N12 Tubulo-interstitial nephritis, not specified as acute or chronic: Secondary | ICD-10-CM | POA: Diagnosis not present

## 2017-11-17 ENCOUNTER — Emergency Department (HOSPITAL_COMMUNITY): Payer: Medicaid Other

## 2017-11-17 ENCOUNTER — Other Ambulatory Visit: Payer: Self-pay

## 2017-11-17 ENCOUNTER — Emergency Department (HOSPITAL_COMMUNITY)
Admission: EM | Admit: 2017-11-17 | Discharge: 2017-11-17 | Disposition: A | Discharge status: Home / Self Care | Payer: Medicaid Other | Attending: Emergency Medicine | Admitting: Emergency Medicine

## 2017-11-17 ENCOUNTER — Encounter (HOSPITAL_COMMUNITY): Payer: Self-pay | Admitting: Emergency Medicine

## 2017-11-17 DIAGNOSIS — R300 Dysuria: Secondary | ICD-10-CM

## 2017-11-17 DIAGNOSIS — N12 Tubulo-interstitial nephritis, not specified as acute or chronic: Secondary | ICD-10-CM

## 2017-11-17 DIAGNOSIS — R509 Fever, unspecified: Secondary | ICD-10-CM

## 2017-11-17 LAB — CBC WITH DIFFERENTIAL/PLATELET
BASOS ABS: 0 10*3/uL (ref 0.0–0.1)
Basophils Relative: 0 %
Eosinophils Absolute: 0 10*3/uL (ref 0.0–0.7)
Eosinophils Relative: 0 %
HEMATOCRIT: 37.4 % (ref 36.0–46.0)
Hemoglobin: 12.8 g/dL (ref 12.0–15.0)
LYMPHS ABS: 1.6 10*3/uL (ref 0.7–4.0)
LYMPHS PCT: 11 %
MCH: 27.8 pg (ref 26.0–34.0)
MCHC: 34.2 g/dL (ref 30.0–36.0)
MCV: 81.3 fL (ref 78.0–100.0)
MONO ABS: 1.5 10*3/uL — AB (ref 0.1–1.0)
Monocytes Relative: 10 %
Neutro Abs: 12.2 10*3/uL — ABNORMAL HIGH (ref 1.7–7.7)
Neutrophils Relative %: 79 %
Platelets: 243 10*3/uL (ref 150–400)
RBC: 4.6 MIL/uL (ref 3.87–5.11)
RDW: 13.6 % (ref 11.5–15.5)
WBC: 15.4 10*3/uL — ABNORMAL HIGH (ref 4.0–10.5)

## 2017-11-17 LAB — URINALYSIS, ROUTINE W REFLEX MICROSCOPIC
Bilirubin Urine: NEGATIVE
GLUCOSE, UA: NEGATIVE mg/dL
KETONES UR: 5 mg/dL — AB
Nitrite: NEGATIVE
PROTEIN: 30 mg/dL — AB
Specific Gravity, Urine: 1.019 (ref 1.005–1.030)
WBC, UA: >50 WBC/hpf — ABNORMAL HIGH (ref 0–5)
pH: 5 (ref 5.0–8.0)

## 2017-11-17 LAB — COMPREHENSIVE METABOLIC PANEL
ALBUMIN: 3.5 g/dL (ref 3.5–5.0)
ALT: 17 U/L (ref 14–54)
AST: 17 U/L (ref 15–41)
Alkaline Phosphatase: 64 U/L (ref 38–126)
Anion gap: 10 (ref 5–15)
BILIRUBIN TOTAL: 1.4 mg/dL — AB (ref 0.3–1.2)
BUN: 9 mg/dL (ref 6–20)
CHLORIDE: 102 mmol/L (ref 101–111)
CO2: 26 mmol/L (ref 22–32)
Calcium: 9 mg/dL (ref 8.9–10.3)
Creatinine, Ser: 0.74 mg/dL (ref 0.44–1.00)
GFR calc Af Amer: >60 mL/min (ref >60)
GFR calc non Af Amer: >60 mL/min (ref >60)
GLUCOSE: 119 mg/dL — AB (ref 65–99)
POTASSIUM: 3.2 mmol/L — AB (ref 3.5–5.1)
SODIUM: 138 mmol/L (ref 135–145)
TOTAL PROTEIN: 8.3 g/dL — AB (ref 6.5–8.1)

## 2017-11-17 LAB — I-STAT BETA HCG BLOOD, ED (MC, WL, AP ONLY)

## 2017-11-17 LAB — I-STAT CG4 LACTIC ACID, ED: LACTIC ACID, VENOUS: 0.77 mmol/L (ref 0.5–1.9)

## 2017-11-17 MED ORDER — SODIUM CHLORIDE 0.9 % IV BOLUS
1000.0000 mL | Freq: Once | INTRAVENOUS | Status: AC
  Administered 2017-11-17: 1000 mL via INTRAVENOUS

## 2017-11-17 MED ORDER — KETOROLAC TROMETHAMINE 30 MG/ML IJ SOLN
30.0000 mg | Freq: Once | INTRAMUSCULAR | Status: AC
  Administered 2017-11-17: 30 mg via INTRAVENOUS
  Filled 2017-11-17: qty 1

## 2017-11-17 MED ORDER — CEPHALEXIN 500 MG PO CAPS: 500.0000 mg | ORAL_CAPSULE | Freq: Three times a day (TID) | ORAL | 0 refills | Status: DC

## 2017-11-17 MED ORDER — PHENAZOPYRIDINE HCL 200 MG PO TABS: 200.0000 mg | ORAL_TABLET | Freq: Three times a day (TID) | ORAL | 0 refills | Status: DC | PRN

## 2017-11-17 MED ORDER — ONDANSETRON 4 MG PO TBDP: 4.0000 mg | ORAL_TABLET | Freq: Three times a day (TID) | ORAL | 0 refills | Status: DC | PRN

## 2017-11-17 MED ORDER — KETOROLAC TROMETHAMINE 30 MG/ML IJ SOLN
INTRAMUSCULAR | Status: AC
  Administered 2017-11-17: 30 mg via INTRAVENOUS
  Filled 2017-11-17: qty 1

## 2017-11-17 MED ORDER — ONDANSETRON HCL 4 MG/2ML IJ SOLN
4.0000 mg | Freq: Once | INTRAMUSCULAR | Status: AC
  Administered 2017-11-17: 4 mg via INTRAVENOUS
  Filled 2017-11-17: qty 2

## 2017-11-17 MED ORDER — SODIUM CHLORIDE 0.9 % IV SOLN
2.0000 g | Freq: Once | INTRAVENOUS | Status: AC
  Administered 2017-11-17: 2 g via INTRAVENOUS
  Filled 2017-11-17: qty 20

## 2017-11-17 MED ORDER — TRAMADOL HCL 50 MG PO TABS: 50.0000 mg | ORAL_TABLET | Freq: Four times a day (QID) | ORAL | 0 refills | Status: DC | PRN

## 2017-11-17 MED ORDER — ACETAMINOPHEN 325 MG PO TABS
650.0000 mg | ORAL_TABLET | Freq: Once | ORAL | Status: AC
  Administered 2017-11-17: 650 mg via ORAL
  Filled 2017-11-17: qty 2

## 2017-11-17 NOTE — ED Provider Notes (Signed)
[facility]-EMERGENCY DEPT Provider Note   CSN: [id number] Arrival date & time: 11/16/17  2326     History   Chief Complaint Chief Complaint  Patient presents with  . Generalized Body Aches  . Fever  . Dysuria    HPI Jessica Hunter is a 27 y.o. female.  The history is provided by the patient and medical records.  Fever    Dysuria   Associated symptoms include flank pain.     27 y.o. F with hx of ASCUS, HPV, presenting to the ED with feeling unwell for several days now.  Specifically she has had back pain, dysuria, low-grade fever, poor appetite, and nausea.  She is currently on her menstrual, unsure of any hematuria.  She denies any vaginal discharge.  She is in a committed relationship with female partner and denies any possibility of pregnancy.  Does report history of UTIs in the past.  Has had very poor intake today.  No medications tried prior to arrival.  Past Medical History:  Diagnosis Date  . History of postpartum eclampsia in 2009 03/03/2013   Occurred PPD#6, was witness in  [facility] ER, treated with magnesium sulfate and transferred to [facility] where she was admitted for two days (07/19/07 -07/21/07)   . Pregnancy induced hypertension    with first preg  . Seizures (HCC)    PP with first preg    Patient Active Problem List   Diagnosis Date Noted  . Breakthrough bleeding on depo provera 02/05/2014  . History of postpartum eclampsia in 2009 03/03/2013  . 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 NO PAST SURGERIES       OB History    Gravida  4   Para  4   Term  4   Preterm      AB      Living  4     SAB      TAB      Ectopic      Multiple      Live Births  4            Home Medications    Prior to Admission medications   Medication Sig Start Date End Date Taking? Authorizing Provider  amoxicillin (AMOXIL) 500 MG capsule Take 2 capsules (1,000 mg  total) by mouth 2 (two) times daily. 12/09/16   Hayden Rasmussen, NP  etonogestrel (NEXPLANON) 68 MG IMPL implant 1 each by Subdermal route once.    [provider]    Family History Family History  Problem Relation Age of Onset  . Diabetes Brother   . Hypertension Father   . Other Neg Hx   . Hearing loss Neg Hx     Social History Social History   Tobacco Use  . Smoking status: Former Games developer  . Smokeless tobacco: Never Used  . Tobacco comment: quit with last preg  Substance Use Topics  . Alcohol use: No  . Drug use: No     Allergies   Patient has no known allergies.   Review of Systems Review of Systems  Constitutional: Positive for fever.  Genitourinary: Positive for dysuria and flank pain.  All other systems reviewed and are negative.    Physical Exam Updated Vital Signs BP 102/71 (BP Location: Left Arm)   Pulse (!) 112   Temp 100.1 F (37.8 C) (Oral)   Resp 18   Ht 5\' 6"  (  1.676 m)   Wt 60.3 kg (133 lb)   LMP 11/17/2017   SpO2 98%   BMI 21.47 kg/m   Physical Exam  Constitutional: She is oriented to person, place, and time. She appears well-developed and well-nourished.  HENT:  Head: Normocephalic and atraumatic.  Mouth/Throat: Oropharynx is clear and moist.  Moist mucous membranes  Eyes: Pupils are equal, round, and reactive to light. Conjunctivae and EOM are normal.  Neck: Normal range of motion.  Cardiovascular: Normal rate, regular rhythm and normal heart sounds.  Pulmonary/Chest: Effort normal and breath sounds normal. No stridor. No respiratory distress.  Abdominal: Soft. Bowel sounds are normal. There is no tenderness. There is CVA tenderness (bilateral). There is no rebound.  Musculoskeletal: Normal range of motion.  Neurological: She is alert and oriented to person, place, and time.  Skin: Skin is warm and dry.  Psychiatric: She has a normal mood and affect.  Nursing note and vitals reviewed.    ED Treatments / Results  Labs (all  labs ordered are listed, but only abnormal results are displayed) Labs Reviewed  COMPREHENSIVE METABOLIC PANEL - Abnormal; Notable for the following components:      Result Value   Potassium 3.2 (*)    Glucose, Bld 119 (*)    Total Protein 8.3 (*)    Total Bilirubin 1.4 (*)    All other components within normal limits  CBC WITH DIFFERENTIAL/PLATELET - Abnormal; Notable for the following components:   WBC 15.4 (*)    Neutro Abs 12.2 (*)    Monocytes Absolute 1.5 (*)    All other components within normal limits  URINALYSIS, ROUTINE W REFLEX MICROSCOPIC - Abnormal; Notable for the following components:   Color, Urine AMBER (*)    APPearance CLOUDY (*)    Hgb urine dipstick MODERATE (*)    Ketones, ur 5 (*)    Protein, ur 30 (*)    Leukocytes, UA LARGE (*)    WBC, UA >50 (*)    Bacteria, UA RARE (*)    Non Squamous Epithelial 0-5 (*)    All other components within normal limits  URINE CULTURE  I-STAT CG4 LACTIC ACID, ED  I-STAT BETA HCG BLOOD, ED ([facility], [facility], [facility] ONLY)    EKG None  Radiology Dg Chest 2 View  Result Date: 11/17/2017 CLINICAL DATA:  Shortness of breath with body aches EXAM: CHEST - 2 VIEW COMPARISON:  None. FINDINGS: The heart size and mediastinal contours are within normal limits. Both lungs are clear. The visualized skeletal structures are unremarkable. IMPRESSION: No active cardiopulmonary disease. Electronically Signed   By: Jasmine Pang M.D.   On: 11/17/2017 00:29    Procedures Procedures (including critical care time)  Medications Ordered in ED Medications  sodium chloride 0.9 % bolus 1,000 mL (0 mLs Intravenous Stopped 11/17/17 0219)  ketorolac (TORADOL) 30 MG/ML injection 30 mg (30 mg Intravenous Given 11/17/17 0131)  ondansetron (ZOFRAN) injection 4 mg (4 mg Intravenous Given 11/17/17 0131)  acetaminophen (TYLENOL) tablet 650 mg (650 mg Oral Given 11/17/17 0139)  cefTRIAXone (ROCEPHIN) 2 g in sodium chloride 0.9 % 100 mL IVPB (0 g Intravenous Stopped  11/17/17 0234)     Initial Impression / Assessment and Plan / ED Course  I have reviewed the triage vital signs and the nursing notes.  Pertinent labs & imaging results that were available during my care of the patient were reviewed by me and considered in my medical decision making (see chart for details).  27 year old female here  due to feeling unwell for the past few days.  She reports some body aches, back pain, dysuria, fever, and chills.  She has a low-grade fever here but is overall nontoxic in appearance.  She does have bilateral CVA tenderness, remainder of abdomen is soft and benign.  Lungs are clear without wheezes or rhonchi.  Chest x-ray obtained from triage and is overall reassuring.  Clinical concern for pyelonephritis.  Will obtain labs, urinalysis.  Given IV fluids, Toradol, Zofran, as well as 2 g Rocephin.  Labs overall reassuring, leukocytosis at 15,000.  Lactate is normal.  UA appears infectious, culture pending.  Patient feeling better after treatment here.  Feel she is stable for outpatient management.  Will discharge home with Keflex, tramadol, Zofran, and Pyridium.  Encouraged oral hydration.  Will need PCP follow-up, does not currently have PCP but has Medicaid.  Given instructions and resources to help find local clinic.  Discussed plan with patient, she acknowledged understanding and agreed with plan of care.  Return precautions given for new or worsening symptoms.  Final Clinical Impressions(s) / ED Diagnoses   Final diagnoses:  Pyelonephritis  Fever, unspecified fever cause  Dysuria    ED Discharge Orders        Ordered    cephALEXin (KEFLEX) 500 MG capsule  3 times daily     11/17/17 0320    traMADol (ULTRAM) 50 MG tablet  Every 6 hours PRN     11/17/17 0320    phenazopyridine (PYRIDIUM) 200 MG tablet  3 times daily PRN     11/17/17 0320    ondansetron (ZOFRAN ODT) 4 MG disintegrating tablet  Every 8 hours PRN     11/17/17 0320       Garlon HatchetSanders, Bostyn Bogie M,  PA-C 11/17/17 0403    Molpus, Jonny RuizJohn, MD 11/17/17 [phone]

## 2017-11-17 NOTE — Discharge Instructions (Signed)
Take the prescribed medication as directed.  Try to make sure to drink plenty of fluids. Pyridium will turn your urine orange/red in color-- this is normal. Follow-up with a primary care doctor-- can call the number on medicaid card or the 800 number on paperwork for assistance finding local clinics. Return to the ED for new or worsening symptoms.

## 2017-11-18 LAB — URINE CULTURE

## 2018-01-23 ENCOUNTER — Ambulatory Visit (INDEPENDENT_AMBULATORY_CARE_PROVIDER_SITE_OTHER): Payer: Medicaid Other | Admitting: Obstetrics and Gynecology

## 2018-01-23 ENCOUNTER — Encounter: Payer: Self-pay | Admitting: Obstetrics and Gynecology

## 2018-01-23 VITALS — BP 113/78 | HR 62 | Ht 66.0 in | Wt 124.0 lb

## 2018-01-23 DIAGNOSIS — Z3046 Encounter for surveillance of implantable subdermal contraceptive: Secondary | ICD-10-CM

## 2018-01-23 NOTE — Progress Notes (Signed)
26 G4P4 here for nexplanon removal. Patient has had the nexplanon since 2016. She is not interested in contraception as she is in a same sex relationship   Removal Patient given informed consent for removal of her Implanon, time out was performed.  Signed copy in the chart.  Appropriate time out taken. Implanon site identified.  Area prepped in usual sterile fashon. One cc of 1% lidocaine was used to anesthetize the area at the distal end of the implant. A small stab incision was made right beside the implant on the distal portion.  The implanon rod was grasped using hemostats and removed without difficulty.  There was less than 3 cc blood loss. There were no complications.  A small amount of antibiotic ointment and steri-strips were applied over the small incision.  A pressure bandage was applied to reduce any bruising.  The patient tolerated the procedure well and was given post procedure instructions.  Patient needs to return for annual exam with pap smear

## 2018-03-06 ENCOUNTER — Ambulatory Visit: Payer: Medicaid Other | Admitting: Obstetrics and Gynecology

## 2018-03-06 ENCOUNTER — Encounter: Payer: Self-pay | Admitting: Obstetrics and Gynecology

## 2018-10-02 ENCOUNTER — Encounter (HOSPITAL_COMMUNITY): Payer: Self-pay

## 2018-10-02 ENCOUNTER — Ambulatory Visit (HOSPITAL_COMMUNITY)
Admission: EM | Admit: 2018-10-02 | Discharge: 2018-10-02 | Disposition: A | Discharge status: Home / Self Care | Payer: Medicaid Other | Attending: Family Medicine | Admitting: Family Medicine

## 2018-10-02 ENCOUNTER — Other Ambulatory Visit: Payer: Self-pay

## 2018-10-02 DIAGNOSIS — L509 Urticaria, unspecified: Secondary | ICD-10-CM | POA: Diagnosis not present

## 2018-10-02 MED ORDER — CETIRIZINE HCL 10 MG PO TABS: 10.0000 mg | ORAL_TABLET | Freq: Every day | ORAL | 0 refills | Status: AC

## 2018-10-02 MED ORDER — PREDNISONE 10 MG (21) PO TBPK: ORAL_TABLET | ORAL | 0 refills | Status: AC

## 2018-10-02 NOTE — ED Provider Notes (Signed)
[facility]    CSN: [id number] Arrival date & time: 10/02/18  1359     [id number] Complaint Chief Complaint  Patient presents with  . Rash    HPI Jessica Hunter is a 28 y.o. female.   Patient is a 28 year old female that presents today with intermittent rash.  Symptoms have been coming and going over the past week.  The rash is very pruritic.  She is not done anything to treat the rash.  Reports being under a lot of stress recently.  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  Reports that she has been wearing gloves.   Denies any fever, joint pain. Denies any recent changes in lotions, detergents, foods or other possible irritants. No recent travel. Nobody else at home has the rash. Patient has been outside but denies any contact with plants or insects. No new foods or medications.   ROS per HPI        Past Medical History:  Diagnosis Date  . History of postpartum eclampsia in 2009 03/03/2013   Occurred PPD#6, was witness in  [facility] ER, treated with magnesium sulfate and transferred to [facility] where she was admitted for two days (07/19/07 -07/21/07)   . Pregnancy induced hypertension    with first preg  . Seizures (HCC)    PP with first preg    Patient Active Problem List   Diagnosis Date Noted  . Breakthrough bleeding on depo provera 02/05/2014  . History of postpartum eclampsia in 2009 03/03/2013  . 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 NO PAST SURGERIES      OB History    Gravida  4   Para  4   Term  4   Preterm      AB      Living  4     SAB      TAB      Ectopic      Multiple      Live Births  4            Home Medications    Prior to Admission medications   Medication Sig Start Date End Date Taking? Authorizing Provider  cetirizine (ZYRTEC) 10 MG tablet Take 1 tablet (10 mg total) by mouth daily. 10/02/18   Dimitrios Balestrieri, Gloris Manchester A, NP   predniSONE (STERAPRED UNI-PAK 21 TAB) 10 MG (21) TBPK tablet 6 tabs for 1 day, then 5 tabs for 1 das, then 4 tabs for 1 day, then 3 tabs for 1 day, 2 tabs for 1 day, then 1 tab for 1 day 10/02/18   Janace Aris, NP    Family History Family History  Problem Relation Age of Onset  . Diabetes Brother   . Hypertension Father   . Other Neg Hx   . Hearing loss Neg Hx     Social History Social History   Tobacco Use  . Smoking status: Former Games developer  . Smokeless tobacco: Never Used  . Tobacco comment: quit with last preg  Substance Use Topics  . Alcohol use: No  . Drug use: No     Allergies   Patient has no known allergies.   Review of Systems Review of Systems   Physical Exam Triage Vital Signs ED Triage Vitals  Enc Vitals Group     BP 10/02/18 1413 (!) 144/80     Pulse Rate 10/02/18  1413 95     Resp 10/02/18 1413 16     Temp 10/02/18 1413 98.4 F (36.9 C)     Temp src --      SpO2 10/02/18 1413 100 %     Weight --      Height --      Head Circumference --      Peak Flow --      Pain Score 10/02/18 1411 0     Pain Loc --      Pain Edu? --      Excl. in GC? --    No data found.  Updated Vital Signs BP (!) 144/80   Pulse 95   Temp 98.4 F (36.9 C)   Resp 16   LMP 08/05/2018   SpO2 100%   Visual Acuity Right Eye Distance:   Left Eye Distance:   Bilateral Distance:    Right Eye Near:   Left Eye Near:    Bilateral Near:     Physical Exam Vitals signs and nursing note reviewed.  Constitutional:      General: She is not in acute distress.    Appearance: Normal appearance. She is not ill-appearing, toxic-appearing or diaphoretic.  HENT:     Head: Normocephalic and atraumatic.     Nose: Nose normal.     Mouth/Throat:     Pharynx: Oropharynx is clear.  Eyes:     Conjunctiva/sclera: Conjunctivae normal.  Neck:     Musculoskeletal: Normal range of motion.  Pulmonary:     Effort: Pulmonary effort is normal.  Musculoskeletal: Normal range of motion.   Skin:    General: Skin is warm and dry.  Neurological:     Mental Status: She is alert.  Psychiatric:        Mood and Affect: Mood normal.      UC Treatments / Results  Labs (all labs ordered are listed, but only abnormal results are displayed) Labs Reviewed - No data to display  EKG None  Radiology No results found.  Procedures Procedures (including critical care time)  Medications Ordered in UC Medications - No data to display  Initial Impression / Assessment and Plan / UC Course  I have reviewed the triage vital signs and the nursing notes.  Pertinent labs & imaging results that were available during my care of the patient were reviewed by me and considered in my medical decision making (see chart for details).    Urticaria   No Obvious rash on exam today.  Patient showed picture of urticaria that has been coming and going for the last week. Picture showed urticaria on legs.  Patient reporting the rash appears  the morning in the evenings and then fades throughout the day.  We will go ahead and treat with steroids to see if this helps the situation.  Most likely her hives are stress related. Instructed she can take Benadryl or Zyrtec for itching Follow up as needed for continued or worsening symptoms  Final Clinical Impressions(s) / UC Diagnoses   Final diagnoses:  Hives     Discharge Instructions     Take the prednisone taper as prescribed Take the Zyrtec daily Follow up as needed for continued or worsening symptoms     ED Prescriptions    Medication Sig Dispense Auth. Provider   predniSONE (STERAPRED UNI-PAK 21 TAB) 10 MG (21) TBPK tablet 6 tabs for 1 day, then 5 tabs for 1 das, then 4 tabs for 1 day, then 3 tabs  for 1 day, 2 tabs for 1 day, then 1 tab for 1 day 21 tablet Tiney Zipper A, NP   cetirizine (ZYRTEC) 10 MG tablet Take 1 tablet (10 mg total) by mouth daily. 30 tablet Dahlia ByesBast, Yvonne Stopher A, NP     Controlled Substance Prescriptions [place] Controlled  Substance Registry consulted? Not Applicable   Janace ArisBast, Taeler Winning A, NP 10/03/18 [id number]

## 2018-10-02 NOTE — ED Triage Notes (Signed)
Pt states that she has had reoccurring rash for past week, rash is on legs arms and face, states that it comes and goes

## 2018-10-02 NOTE — Discharge Instructions (Signed)
Take the prednisone taper as prescribed Take the Zyrtec daily Follow up as needed for continued or worsening symptoms

## 2019-12-22 ENCOUNTER — Other Ambulatory Visit: Payer: Self-pay

## 2019-12-22 ENCOUNTER — Other Ambulatory Visit (HOSPITAL_COMMUNITY)
Admission: RE | Admit: 2019-12-22 | Discharge: 2019-12-22 | Disposition: A | Discharge status: Home / Self Care | Payer: Medicaid Other | Source: Ambulatory Visit | Attending: Obstetrics and Gynecology | Admitting: Obstetrics and Gynecology

## 2019-12-22 ENCOUNTER — Encounter: Payer: Self-pay | Admitting: Obstetrics and Gynecology

## 2019-12-22 ENCOUNTER — Ambulatory Visit (INDEPENDENT_AMBULATORY_CARE_PROVIDER_SITE_OTHER): Payer: Medicaid Other | Admitting: Obstetrics and Gynecology

## 2019-12-22 VITALS — BP 121/81 | HR 63 | Ht 66.0 in | Wt 126.5 lb

## 2019-12-22 DIAGNOSIS — Z124 Encounter for screening for malignant neoplasm of cervix: Secondary | ICD-10-CM

## 2019-12-22 DIAGNOSIS — Z01419 Encounter for gynecological examination (general) (routine) without abnormal findings: Secondary | ICD-10-CM | POA: Insufficient documentation

## 2019-12-22 NOTE — Progress Notes (Signed)
Subjective:     Jessica Hunter is a 29 y.o. female P4 with BMI 20 and LMP 11/23/19 and is here for a comprehensive physical exam. The patient reports heavy monthly periods. She reports a monthly 6-day period with heavy flow. She reports chronic fatigue and cold intolerance. Patient also reports an inability to gain weight. She is sexually active in a same sex relationship. She is not using any contraception but has tried depo-provera for cycle control previously without improvement. Patient is without any other complaints. She denies pelvic pain or abnormal discharge. Patient desires STI testing  Past Medical History:  Diagnosis Date  . History of postpartum eclampsia in 2009 03/03/2013   Occurred PPD#6, was witness in  [facility] ER, treated with magnesium sulfate and transferred to [facility] where she was admitted for two days (07/19/07 -07/21/07)   . Pregnancy induced hypertension    with first preg  . Seizures (HCC)    PP with first preg   Past Surgical History:  Procedure Laterality Date  . NO PAST SURGERIES     Family History  Problem Relation Age of Onset  . Diabetes Brother   . Hypertension Father   . Other Neg Hx   . Hearing loss Neg Hx      Social History   Socioeconomic History  . Marital status: Single    Spouse name: Not on file  . Number of children: Not on file  . Years of education: Not on file  . Highest education level: Not on file  Occupational History  . Not on file  Tobacco Use  . Smoking status: Former Games developer  . Smokeless tobacco: Never Used  . Tobacco comment: quit with last preg  Vaping Use  . Vaping Use: Never used  Substance and Sexual Activity  . Alcohol use: No  . Drug use: No  . Sexual activity: Yes    Birth control/protection: Injection  Other Topics Concern  . Not on file  Social History Narrative  . Not on file   Social Determinants of Health   Financial Resource Strain:   . Difficulty of Paying Living Expenses:   Food Insecurity:   . Worried  About Programme researcher, broadcasting/film/video in the Last Year:   . Barista in the Last Year:   Transportation Needs:   . Freight forwarder (Medical):   Marland Kitchen Lack of Transportation (Non-Medical):   Physical Activity:   . Days of Exercise per Week:   . Minutes of Exercise per Session:   Stress:   . Feeling of Stress :   Social Connections:   . Frequency of Communication with Friends and Family:   . Frequency of Social Gatherings with Friends and Family:   . Attends Religious Services:   . Active Member of Clubs or Organizations:   . Attends Banker Meetings:   Marland Kitchen Marital Status:   Intimate Partner Violence:   . Fear of Current or Ex-Partner:   . Emotionally Abused:   Marland Kitchen Physically Abused:   . Sexually Abused:    Health Maintenance  Topic Date Due  . Hepatitis C Screening  Never done  . COVID-19 Vaccine (1) Never done  . PAP-Cervical Cytology Screening  12/22/2017  . PAP SMEAR-Modifier  12/22/2017  . INFLUENZA VACCINE  01/03/2020  . TETANUS/TDAP  10/23/2022  . HIV Screening  Completed       Review of Systems Pertinent items noted in HPI and remainder of comprehensive ROS otherwise negative.  Objective:  Blood pressure 121/81, pulse 63, height 5\' 6"  (1.676 m), weight 126 lb 8 oz (57.4 kg), last menstrual period 11/23/2019.     GENERAL: Well-developed, well-nourished female in no acute distress.  HEENT: Normocephalic, atraumatic. Sclerae anicteric.  NECK: Supple. Normal thyroid.  LUNGS: Clear to auscultation bilaterally.  HEART: Regular rate and rhythm. BREASTS: Symmetric in size. No palpable masses or lymphadenopathy, skin changes, or nipple drainage. ABDOMEN: Soft, nontender, nondistended. No organomegaly. PELVIC: Normal external female genitalia. Vagina is pink and rugated.  Normal discharge. Normal appearing cervix. Uterus is normal in size. No adnexal mass or tenderness. EXTREMITIES: No cyanosis, clubbing, or edema, 2+ distal pulses.    Assessment:    Healthy  female exam.      Plan:    pap smear collected STI screening collected CBC and TSH collected Patient will be contacted with abnormal results Contraception counseling provided for cycle control. Patient undecided at this time See After Visit Summary for Counseling Recommendations

## 2019-12-22 NOTE — Patient Instructions (Signed)
Contraception Choices Contraception, also called birth control, refers to methods or devices that prevent pregnancy. Hormonal methods Contraceptive implant  A contraceptive implant is a thin, plastic tube that contains a hormone. It is inserted into the upper part of the arm. It can remain in place for up to 3 years. Progestin-only injections Progestin-only injections are injections of progestin, a synthetic form of the hormone progesterone. They are given every 3 months by a health care provider. Birth control pills  Birth control pills are pills that contain hormones that prevent pregnancy. They must be taken once a day, preferably at the same time each day. Birth control patch  The birth control patch contains hormones that prevent pregnancy. It is placed on the skin and must be changed once a week for three weeks and removed on the fourth week. A prescription is needed to use this method of contraception. Vaginal ring  A vaginal ring contains hormones that prevent pregnancy. It is placed in the vagina for three weeks and removed on the fourth week. After that, the process is repeated with a new ring. A prescription is needed to use this method of contraception. Emergency contraceptive Emergency contraceptives prevent pregnancy after unprotected sex. They come in pill form and can be taken up to 5 days after sex. They work best the sooner they are taken after having sex. Most emergency contraceptives are available without a prescription. This method should not be used as your only form of birth control. Barrier methods Female condom  A female condom is a thin sheath that is worn over the penis during sex. Condoms keep sperm from going inside a woman's body. They can be used with a spermicide to increase their effectiveness. They should be disposed after a single use. Female condom  A female condom is a soft, loose-fitting sheath that is put into the vagina before sex. The condom keeps sperm  from going inside a woman's body. They should be disposed after a single use. Diaphragm  A diaphragm is a soft, dome-shaped barrier. It is inserted into the vagina before sex, along with a spermicide. The diaphragm blocks sperm from entering the uterus, and the spermicide kills sperm. A diaphragm should be left in the vagina for 6-8 hours after sex and removed within 24 hours. A diaphragm is prescribed and fitted by a health care provider. A diaphragm should be replaced every 1-2 years, after giving birth, after gaining more than 15 lb (6.8 kg), and after pelvic surgery. Cervical cap  A cervical cap is a round, soft latex or plastic cup that fits over the cervix. It is inserted into the vagina before sex, along with spermicide. It blocks sperm from entering the uterus. The cap should be left in place for 6-8 hours after sex and removed within 48 hours. A cervical cap must be prescribed and fitted by a health care provider. It should be replaced every 2 years. Sponge  A sponge is a soft, circular piece of polyurethane foam with spermicide on it. The sponge helps block sperm from entering the uterus, and the spermicide kills sperm. To use it, you make it wet and then insert it into the vagina. It should be inserted before sex, left in for at least 6 hours after sex, and removed and thrown away within 30 hours. Spermicides Spermicides are chemicals that kill or block sperm from entering the cervix and uterus. They can come as a cream, jelly, suppository, foam, or tablet. A spermicide should be inserted into the   vagina with an applicator at least 10-15 minutes before sex to allow time for it to work. The process must be repeated every time you have sex. Spermicides do not require a prescription. Intrauterine contraception Intrauterine device (IUD) An IUD is a T-shaped device that is put in a woman's uterus. There are two types:  Hormone IUD.This type contains progestin, a synthetic form of the hormone  progesterone. This type can stay in place for 3-5 years.  Copper IUD.This type is wrapped in copper wire. It can stay in place for 10 years.  Permanent methods of contraception Female tubal ligation In this method, a woman's fallopian tubes are sealed, tied, or blocked during surgery to prevent eggs from traveling to the uterus. Hysteroscopic sterilization In this method, a small, flexible insert is placed into each fallopian tube. The inserts cause scar tissue to form in the fallopian tubes and block them, so sperm cannot reach an egg. The procedure takes about 3 months to be effective. Another form of birth control must be used during those 3 months. Female sterilization This is a procedure to tie off the tubes that carry sperm (vasectomy). After the procedure, the man can still ejaculate fluid (semen). Natural planning methods Natural family planning In this method, a couple does not have sex on days when the woman could become pregnant. Calendar method This means keeping track of the length of each menstrual cycle, identifying the days when pregnancy can happen, and not having sex on those days. Ovulation method In this method, a couple avoids sex during ovulation. Symptothermal method This method involves not having sex during ovulation. The woman typically checks for ovulation by watching changes in her temperature and in the consistency of cervical mucus. Post-ovulation method In this method, a couple waits to have sex until after ovulation. Summary  Contraception, also called birth control, means methods or devices that prevent pregnancy.  Hormonal methods of contraception include implants, injections, pills, patches, vaginal rings, and emergency contraceptives.  Barrier methods of contraception can include female condoms, female condoms, diaphragms, cervical caps, sponges, and spermicides.  There are two types of IUDs (intrauterine devices). An IUD can be put in a woman's uterus to  prevent pregnancy for 3-5 years.  Permanent sterilization can be done through a procedure for males, females, or both.  Natural family planning methods involve not having sex on days when the woman could become pregnant. This information is not intended to replace advice given to you by your health care provider. Make sure you discuss any questions you have with your health care provider. Document Revised: 05/23/2017 Document Reviewed: 06/23/2016 Elsevier Patient Education  2020 [employer]  

## 2019-12-23 LAB — HEPATITIS B SURFACE ANTIGEN: Hepatitis B Surface Ag: NEGATIVE

## 2019-12-23 LAB — CBC
Hematocrit: 38.7 % (ref 34.0–46.6)
Hemoglobin: 12.3 g/dL (ref 11.1–15.9)
MCH: 26.8 pg (ref 26.6–33.0)
MCHC: 31.8 g/dL (ref 31.5–35.7)
MCV: 84 fL (ref 79–97)
Platelets: 226 10*3/uL (ref 150–450)
RBC: 4.59 x10E6/uL (ref 3.77–5.28)
RDW: 13.1 % (ref 11.7–15.4)
WBC: 7.2 10*3/uL (ref 3.4–10.8)

## 2019-12-23 LAB — TSH: TSH: 1.52 u[IU]/mL (ref 0.450–4.500)

## 2019-12-23 LAB — RPR: RPR Ser Ql: NONREACTIVE

## 2019-12-23 LAB — HIV ANTIBODY (ROUTINE TESTING W REFLEX): HIV Screen 4th Generation wRfx: NONREACTIVE

## 2019-12-23 LAB — HEPATITIS C ANTIBODY: Hep C Virus Ab: <0.1 s/co ratio (ref 0.0–0.9)

## 2019-12-24 LAB — CYTOLOGY - PAP
Chlamydia: NEGATIVE
Comment: NEGATIVE
Comment: NEGATIVE
Comment: NORMAL
Diagnosis: NEGATIVE
Neisseria Gonorrhea: NEGATIVE
Trichomonas: NEGATIVE
